# Patient Record
Sex: Female | Born: 1985
Health system: Southern US, Community
[De-identification: ages and names within clinical notes are randomized; demographics above are authoritative.]

## PROBLEM LIST (undated history)

## (undated) DIAGNOSIS — R253 Fasciculation: Secondary | ICD-10-CM

## (undated) DIAGNOSIS — D1803 Hemangioma of intra-abdominal structures: Secondary | ICD-10-CM

## (undated) DIAGNOSIS — E559 Vitamin D deficiency, unspecified: Secondary | ICD-10-CM

## (undated) DIAGNOSIS — F419 Anxiety disorder, unspecified: Secondary | ICD-10-CM

## (undated) DIAGNOSIS — E7800 Pure hypercholesterolemia, unspecified: Secondary | ICD-10-CM

## (undated) DIAGNOSIS — L709 Acne, unspecified: Secondary | ICD-10-CM

## (undated) HISTORY — PX: OTHER SURGICAL HISTORY: SHX169

## (undated) HISTORY — DX: Fasciculation: R25.3

## (undated) HISTORY — DX: Vitamin D deficiency, unspecified: E55.9

## (undated) HISTORY — DX: Acne, unspecified: L70.9

## (undated) HISTORY — DX: Hemangioma of intra-abdominal structures: D18.03

## (undated) HISTORY — DX: Pure hypercholesterolemia, unspecified: E78.00

## (undated) HISTORY — PX: WISDOM TOOTH EXTRACTION: SHX21

---

## 2010-02-07 ENCOUNTER — Encounter: Admission: RE | Admit: 2010-02-07 | Discharge: 2010-02-07 | Payer: Self-pay | Admitting: Obstetrics and Gynecology

## 2010-02-11 ENCOUNTER — Encounter: Admission: RE | Admit: 2010-02-11 | Discharge: 2010-02-11 | Payer: Self-pay | Admitting: Obstetrics and Gynecology

## 2010-11-25 ENCOUNTER — Emergency Department (HOSPITAL_COMMUNITY)
Admission: EM | Admit: 2010-11-25 | Discharge: 2010-11-25 | Payer: Self-pay | Source: Home / Self Care | Admitting: Family Medicine

## 2010-12-10 ENCOUNTER — Encounter
Admission: RE | Admit: 2010-12-10 | Discharge: 2010-12-10 | Payer: Self-pay | Source: Home / Self Care | Attending: Obstetrics and Gynecology | Admitting: Obstetrics and Gynecology

## 2014-12-15 ENCOUNTER — Emergency Department
Admission: EM | Admit: 2014-12-15 | Discharge: 2014-12-15 | Disposition: A | Discharge status: Home / Self Care | Payer: PRIVATE HEALTH INSURANCE | Source: Home / Self Care | Attending: Emergency Medicine | Admitting: Emergency Medicine

## 2014-12-15 ENCOUNTER — Encounter: Payer: Self-pay | Admitting: *Deleted

## 2014-12-15 DIAGNOSIS — J029 Acute pharyngitis, unspecified: Secondary | ICD-10-CM

## 2014-12-15 HISTORY — DX: Anxiety disorder, unspecified: F41.9

## 2014-12-15 LAB — POCT RAPID STREP A (OFFICE): Rapid Strep A Screen: NEGATIVE

## 2014-12-15 MED ORDER — AZITHROMYCIN 250 MG PO TABS: ORAL_TABLET | ORAL | Status: DC

## 2014-12-15 NOTE — ED Notes (Signed)
Pt c/o sore throat x 4 days. Denies fever. No other URI s/s. Reports coworkers with strep.

## 2014-12-15 NOTE — ED Provider Notes (Signed)
CSN: [id number]     Arrival date & time 12/15/14  1347 History   First MD Initiated Contact with Patient 12/15/14 1422     Chief Complaint  Patient presents with  . Sore Throat   (Consider location/radiation/quality/duration/timing/severity/associated sxs/prior Treatment) HPI SORE THROAT Onset: 4 days    Severity: moderate, but progressively worsening Tried OTC meds without significant relief.  Symptoms:  + Fever initially, fever is much less now. 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 Anxiety    History reviewed. No pertinent past surgical history. History reviewed. No pertinent family history. History  Substance Use Topics  . Smoking status: Never Smoker   . Smokeless tobacco: Not on file  . Alcohol Use: Yes   OB History    No data available     Review of Systems  All other systems reviewed and are negative.   Allergies  Penicillins  Home Medications   Prior to Admission medications   Medication Sig Start Date End Date Taking? Authorizing Provider  sertraline (ZOLOFT) 50 MG tablet Take 50 mg by mouth daily.   Yes Historical Provider, MD  azithromycin (ZITHROMAX Z-PAK) 250 MG tablet Take 2 tablets on day one, then 1 tablet daily on days 2 through 5 12/15/14   Jacqulyn Cane, MD   BP 108/65 mmHg  Pulse 77  Temp(Src) 99.1 F (37.3 C) (Oral)  Resp 16  Ht 5\' 6"  (1.676 m)  Wt 124 lb (56.246 kg)  BMI 20.02 kg/m2  SpO2 100%  LMP 11/19/2014 Physical Exam   Constitutional: She is oriented to person, place, and time. She appears well-developed and well-nourished. She is cooperative.  Non-toxic appearance. No distress.  HENT:  Head: Normocephalic and atraumatic.  Right Ear: Tympanic membrane, external ear and ear canal normal.  Left Ear: Tympanic membrane, external ear and ear canal normal.  Nose: Nose normal. Right sinus exhibits no maxillary sinus tenderness and no frontal sinus tenderness. Left sinus exhibits no maxillary sinus tenderness and no frontal sinus tenderness.  Mouth/Throat: Mucous membranes are normal. Posterior oropharyngeal erythema present. No oropharyngeal exudate or posterior oropharyngeal edema.  Eyes: Conjunctivae are normal. No scleral icterus.  Neck: Neck supple.  Cardiovascular: Normal rate, regular rhythm and normal heart sounds.   No murmur heard. Pulmonary/Chest: Effort normal and breath sounds normal. No stridor. No respiratory distress. She has no wheezes. She has no rales.  Musculoskeletal: She exhibits no edema.  Lymphadenopathy:    She has cervical adenopathy.       Right cervical: Superficial cervical adenopathy present. No deep cervical and no posterior cervical adenopathy present.      Left cervical: Superficial cervical adenopathy present. No deep cervical and no posterior cervical adenopathy present.  Neurological: She is alert and oriented to person, place, and time.  Skin: Skin is warm and dry.  Psychiatric: She has a normal mood and affect.  Nursing note and vitals reviewed.   ED Course  Procedures (including critical care time) Labs Review Labs Reviewed  STREP A DNA PROBE  POCT RAPID STREP A (OFFICE)    Imaging Review No results found.   MDM   1. Acute pharyngitis, unspecified pharyngitis type    Rapid strep test negative. Treatment options discussed. We are sending off a strep culture. Reviewed her history of penicillin allergy. Prescribed Zithromax Z-PAK Other symptomatic care  discussed Follow-up with your primary care doctor in 5-7 days if not improving, or sooner if symptoms become worse. Precautions discussed. Red flags discussed. Questions invited and answered. She voiced understanding and agreement.     Jacqulyn Cane, MD 12/15/14 [phone]

## 2014-12-16 LAB — STREP A DNA PROBE: GASP: NEGATIVE

## 2014-12-17 ENCOUNTER — Telehealth: Payer: Self-pay | Admitting: Emergency Medicine

## 2014-12-17 NOTE — ED Notes (Signed)
Inquired about patient's status; encourage them to call with questions/concerns.  

## 2015-05-29 DIAGNOSIS — L700 Acne vulgaris: Secondary | ICD-10-CM | POA: Insufficient documentation

## 2016-12-23 ENCOUNTER — Other Ambulatory Visit: Payer: Self-pay | Admitting: Family Medicine

## 2016-12-23 DIAGNOSIS — R1033 Periumbilical pain: Secondary | ICD-10-CM

## 2016-12-25 ENCOUNTER — Ambulatory Visit
Admission: RE | Admit: 2016-12-25 | Discharge: 2016-12-25 | Disposition: A | Discharge status: Home / Self Care | Payer: PPO | Source: Ambulatory Visit | Attending: Family Medicine | Admitting: Family Medicine

## 2016-12-25 DIAGNOSIS — R1033 Periumbilical pain: Secondary | ICD-10-CM

## 2016-12-25 MED ORDER — IOPAMIDOL (ISOVUE-300) INJECTION 61%
100.0000 mL | Freq: Once | INTRAVENOUS | Status: AC | PRN
  Administered 2016-12-25: 100 mL via INTRAVENOUS

## 2018-12-01 NOTE — L&D Delivery Note (Signed)
Delivery Note Pt pushed great for 20 minutes and at 5:23 PM a healthy female was delivered via Vaginal, Spontaneous (Presentation: Right Occiput Anterior).  APGAR: 7, 9; weight  pending.   Placenta status: Spontaneous, Intact.  Cord: 3 vessels with the following complications: Nuchal x 1 reduced.   There was mild uterine atony, but responded well to pitocin and massage  Anesthesia: Epidural Episiotomy: None Lacerations: 2nd degree, right sulcal Suture Repair: 3.0 vicryl rapide Est. Blood Loss (mL):  455mL  Mom to postpartum.  Baby to Couplet care / Skin to Skin.  Debra Craig 11/08/2019, 6:19 PM

## 2019-04-19 LAB — OB RESULTS CONSOLE GC/CHLAMYDIA
Chlamydia: NEGATIVE
Gonorrhea: NEGATIVE

## 2019-04-21 LAB — OB RESULTS CONSOLE HEPATITIS B SURFACE ANTIGEN: Hepatitis B Surface Ag: NEGATIVE

## 2019-04-21 LAB — OB RESULTS CONSOLE RUBELLA ANTIBODY, IGM: Rubella: IMMUNE

## 2019-08-01 ENCOUNTER — Encounter (HOSPITAL_COMMUNITY): Payer: Self-pay

## 2019-08-01 ENCOUNTER — Other Ambulatory Visit: Payer: Self-pay

## 2019-08-01 ENCOUNTER — Inpatient Hospital Stay (HOSPITAL_COMMUNITY)
Admission: AD | Admit: 2019-08-01 | Discharge: 2019-08-01 | Disposition: A | Discharge status: Home / Self Care | Payer: PPO | Attending: Obstetrics and Gynecology | Admitting: Obstetrics and Gynecology

## 2019-08-01 DIAGNOSIS — O99342 Other mental disorders complicating pregnancy, second trimester: Secondary | ICD-10-CM | POA: Insufficient documentation

## 2019-08-01 DIAGNOSIS — Z880 Allergy status to penicillin: Secondary | ICD-10-CM | POA: Insufficient documentation

## 2019-08-01 DIAGNOSIS — Z79899 Other long term (current) drug therapy: Secondary | ICD-10-CM | POA: Insufficient documentation

## 2019-08-01 DIAGNOSIS — W19XXXA Unspecified fall, initial encounter: Secondary | ICD-10-CM | POA: Insufficient documentation

## 2019-08-01 DIAGNOSIS — F419 Anxiety disorder, unspecified: Secondary | ICD-10-CM | POA: Insufficient documentation

## 2019-08-01 DIAGNOSIS — Z3A24 24 weeks gestation of pregnancy: Secondary | ICD-10-CM | POA: Insufficient documentation

## 2019-08-01 DIAGNOSIS — O9A212 Injury, poisoning and certain other consequences of external causes complicating pregnancy, second trimester: Secondary | ICD-10-CM | POA: Insufficient documentation

## 2019-08-01 DIAGNOSIS — Z3689 Encounter for other specified antenatal screening: Secondary | ICD-10-CM | POA: Insufficient documentation

## 2019-08-01 NOTE — MAU Provider Note (Signed)
History     CSN: [id number]  Arrival date and time: 08/01/19 [id number]   First Provider Initiated Contact with Patient 08/01/19 [phone]      Chief Complaint  Patient presents with  . Fall   33 y.o. G1 @24 .1 wks presenting after a fall. Reports missing one step and falling down scraping her left ribcage and flank on a chair. No head trauma or LOC. Did not land on abdomen. Denies VB, LOF, or ctx. +FM. No abdominal pain but ribs are sore. Rates pain 3/10.   OB History    Gravida  1   Para      Term      Preterm      AB      Living        SAB      TAB      Ectopic      Multiple      Live Births              Past Medical History:  Diagnosis Date  . Anxiety     History reviewed. No pertinent surgical history.  History reviewed. No pertinent family history.  Social History   Tobacco Use  . Smoking status: Never Smoker  . Smokeless tobacco: Never Used  Substance Use Topics  . Alcohol use: Not Currently  . Drug use: No    Allergies:  Allergies  Allergen Reactions  . Penicillins     Medications Prior to Admission  Medication Sig Dispense Refill Last Dose  . azithromycin (ZITHROMAX Z-PAK) 250 MG tablet Take 2 tablets on day one, then 1 tablet daily on days 2 through 5 1 each 0   . sertraline (ZOLOFT) 50 MG tablet Take 50 mg by mouth daily.       Review of Systems  Gastrointestinal: Negative for abdominal pain.  Genitourinary: Negative for vaginal bleeding and vaginal discharge.   Physical Exam   Blood pressure (!) 122/59, pulse 80, temperature 98 F (36.7 C), temperature source Oral.  Physical Exam  Nursing note and vitals reviewed. Constitutional: She is oriented to person, place, and time. She appears well-developed and well-nourished. No distress.  Respiratory:    GI: Soft. She exhibits no distension. There is no abdominal tenderness.  gravid  Musculoskeletal: Normal range of motion.  Neurological: She is alert and oriented to person,  place, and time.  Skin: Skin is warm and dry.  Psychiatric: She has a normal mood and affect.  EFM: 155 bpm, mod variability, + accels, no decels Toco: none  No results found for this or any previous visit (from the past 24 hour(s)).  MAU Course  Procedures Prolonged EFM  MDM NST reactive. Pain is minimal. No evidence abruption or PTL. Stable for discharge home.   Assessment and Plan   1. [redacted] weeks gestation of pregnancy   2. Fall, initial encounter   3. NST (non-stress test) reactive    Discharge home [facility] this [facility] as scheduled Strict return precautions- VB, LOF, ctx, pain, dec FM Tylenol and heat prn  Allergies as of 08/01/2019      Reactions   Penicillins       Medication List    STOP taking these medications   azithromycin 250 MG tablet Commonly known as: Zithromax Z-Pak     TAKE these medications   sertraline 50 MG tablet Commonly known as: ZOLOFT Take 50 mg by mouth daily.      Julianne Handler, CNM 08/01/2019, 10:07 AM

## 2019-08-01 NOTE — Discharge Instructions (Signed)
Preventing Injuries During Pregnancy ° °Injuries can happen during pregnancy. Minor falls and accidents usually do not harm you or your baby. But some injuries can harm you and your baby. Tell your doctor about any injury you suffer. °What can I do to avoid injuries? °Safety °· Remove rugs and loose objects on the floor. °· Wear comfortable shoes that have a good grip. Do not wear shoes that have high heels. °· Always wear your seat belt in the car. The lap belt should be below your belly. Always drive safely. °· Do not ride on a motorcycle. °Activity °· Do not take part in rough and violent activities or sports. °· Avoid: °? Walking on wet or slippery floors. °? Lifting heavy pots of boiling or hot liquids. °? Fixing electrical problems. °? Being near fires. °General instructions °· Take over-the-counter and prescription medicines only as told by your doctor. °· Know your blood type and the blood type of the baby's father. °· If you are a victim of domestic violence: °? Call your local emergency services (911 in the [place] °? Contact the National Domestic Violence Hotline for help and support. °Get help right away if: °· You fall on your belly or receive any serious blow to your belly. °· You have a stiff neck or neck pain after a fall or an injury. °· You get a headache or have problems with vision after an injury. °· You do not feel the baby move or the baby is not moving as much as normal. °· You have been a victim of domestic violence or any other kind of attack. °· You have been in a car accident. °· You have bleeding from your vagina. °· Fluid is leaking from your vagina. °· You start to have cramping or pain in your belly (contractions). °· You have very bad pain in your lower back. °· You feel weak or pass out (faint). °· You start to throw up (vomit) after an injury. °· You have been burned. °Summary °· Some injuries that happen during pregnancy can do harm to the baby. °· Tell your doctor about any  injury. °· Take steps to avoid injury. This includes removing rugs and loose objects on the floor. Always wear your seat belt in the car. °· Do not take part in rough and violent activities or sports. °· Get help right away if you have any serious accident or injury. °This information is not intended to replace advice given to you by your health care provider. Make sure you discuss any questions you have with your health care provider. °Document Released: 12/20/2010 Document Revised: 11/26/2016 Document Reviewed: 11/26/2016 °Elsevier Patient Education © 2020 [employer] ° °

## 2019-08-01 NOTE — MAU Note (Signed)
Pt is a G1P0 at 24.0 weeks that fell down last step at home today at 0600.  Pt states she landed on her side.  No bleeding, contractions or LOF.  Pt has been feeling baby move normally.  No other OB or medical concerns.

## 2019-09-01 DIAGNOSIS — Z3689 Encounter for other specified antenatal screening: Secondary | ICD-10-CM | POA: Diagnosis not present

## 2019-09-02 LAB — OB RESULTS CONSOLE HIV ANTIBODY (ROUTINE TESTING): HIV: NONREACTIVE

## 2019-09-02 LAB — OB RESULTS CONSOLE RPR: RPR: NONREACTIVE

## 2019-09-06 DIAGNOSIS — O99810 Abnormal glucose complicating pregnancy: Secondary | ICD-10-CM | POA: Diagnosis not present

## 2019-09-27 DIAGNOSIS — O99343 Other mental disorders complicating pregnancy, third trimester: Secondary | ICD-10-CM | POA: Diagnosis not present

## 2019-09-27 DIAGNOSIS — Z3A32 32 weeks gestation of pregnancy: Secondary | ICD-10-CM | POA: Diagnosis not present

## 2019-09-27 DIAGNOSIS — Z23 Encounter for immunization: Secondary | ICD-10-CM | POA: Diagnosis not present

## 2019-09-30 DIAGNOSIS — D692 Other nonthrombocytopenic purpura: Secondary | ICD-10-CM | POA: Diagnosis not present

## 2019-10-10 DIAGNOSIS — Z3482 Encounter for supervision of other normal pregnancy, second trimester: Secondary | ICD-10-CM | POA: Diagnosis not present

## 2019-10-10 DIAGNOSIS — Z3483 Encounter for supervision of other normal pregnancy, third trimester: Secondary | ICD-10-CM | POA: Diagnosis not present

## 2019-10-25 DIAGNOSIS — Z3685 Encounter for antenatal screening for Streptococcus B: Secondary | ICD-10-CM | POA: Diagnosis not present

## 2019-10-29 LAB — OB RESULTS CONSOLE GBS: GBS: NEGATIVE

## 2019-11-04 MED FILL — SERTRALINE HCL 50 MG TABS: 50 | 90 days supply | Qty: 90 | Fill #0

## 2019-11-08 ENCOUNTER — Encounter (HOSPITAL_COMMUNITY): Payer: Self-pay

## 2019-11-08 ENCOUNTER — Inpatient Hospital Stay (HOSPITAL_COMMUNITY)
Admission: AD | Admit: 2019-11-08 | Discharge: 2019-11-10 | DRG: 807 | Disposition: A | Discharge status: Home / Self Care | Payer: 59 | Attending: Obstetrics and Gynecology | Admitting: Obstetrics and Gynecology

## 2019-11-08 ENCOUNTER — Inpatient Hospital Stay (HOSPITAL_COMMUNITY): Payer: 59 | Admitting: Anesthesiology

## 2019-11-08 ENCOUNTER — Other Ambulatory Visit: Payer: Self-pay

## 2019-11-08 DIAGNOSIS — Z3A38 38 weeks gestation of pregnancy: Secondary | ICD-10-CM | POA: Diagnosis not present

## 2019-11-08 DIAGNOSIS — Z20828 Contact with and (suspected) exposure to other viral communicable diseases: Secondary | ICD-10-CM | POA: Diagnosis present

## 2019-11-08 DIAGNOSIS — F419 Anxiety disorder, unspecified: Secondary | ICD-10-CM | POA: Diagnosis present

## 2019-11-08 DIAGNOSIS — O99344 Other mental disorders complicating childbirth: Secondary | ICD-10-CM | POA: Diagnosis present

## 2019-11-08 DIAGNOSIS — O26893 Other specified pregnancy related conditions, third trimester: Secondary | ICD-10-CM | POA: Diagnosis present

## 2019-11-08 LAB — COMPREHENSIVE METABOLIC PANEL
ALT: 15 U/L (ref 0–44)
AST: 24 U/L (ref 15–41)
Albumin: 3.2 g/dL — ABNORMAL LOW (ref 3.5–5.0)
Alkaline Phosphatase: 141 U/L — ABNORMAL HIGH (ref 38–126)
Anion gap: 11 (ref 5–15)
BUN: 12 mg/dL (ref 6–20)
CO2: 20 mmol/L — ABNORMAL LOW (ref 22–32)
Calcium: 9.1 mg/dL (ref 8.9–10.3)
Chloride: 104 mmol/L (ref 98–111)
Creatinine, Ser: 0.81 mg/dL (ref 0.44–1.00)
GFR calc Af Amer: >60 mL/min (ref >60)
GFR calc non Af Amer: >60 mL/min (ref >60)
Glucose, Bld: 88 mg/dL (ref 70–99)
Potassium: 3.8 mmol/L (ref 3.5–5.1)
Sodium: 135 mmol/L (ref 135–145)
Total Bilirubin: 0.2 mg/dL — ABNORMAL LOW (ref 0.3–1.2)
Total Protein: 6.5 g/dL (ref 6.5–8.1)

## 2019-11-08 LAB — PROTEIN / CREATININE RATIO, URINE
Creatinine, Urine: 31.03 mg/dL
Total Protein, Urine: <6 mg/dL

## 2019-11-08 LAB — CBC
HCT: 40.3 % (ref 36.0–46.0)
Hemoglobin: 13.9 g/dL (ref 12.0–15.0)
MCH: 32.9 pg (ref 26.0–34.0)
MCHC: 34.5 g/dL (ref 30.0–36.0)
MCV: 95.3 fL (ref 80.0–100.0)
Platelets: 292 10*3/uL (ref 150–400)
RBC: 4.23 MIL/uL (ref 3.87–5.11)
RDW: 12.3 % (ref 11.5–15.5)
WBC: 12.5 10*3/uL — ABNORMAL HIGH (ref 4.0–10.5)
nRBC: 0 % (ref 0.0–0.2)

## 2019-11-08 LAB — SARS CORONAVIRUS 2 (TAT 6-24 HRS): SARS Coronavirus 2: NEGATIVE

## 2019-11-08 LAB — TYPE AND SCREEN
ABO/RH(D): A POS
Antibody Screen: NEGATIVE

## 2019-11-08 LAB — ABO/RH: ABO/RH(D): A POS

## 2019-11-08 MED ORDER — DIBUCAINE (PERIANAL) 1 % EX OINT: 1.0000 "application " | TOPICAL_OINTMENT | CUTANEOUS | Status: DC | PRN

## 2019-11-08 MED ORDER — ONDANSETRON HCL 4 MG/2ML IJ SOLN: 4.0000 mg | INTRAMUSCULAR | Status: DC | PRN

## 2019-11-08 MED ORDER — COCONUT OIL OIL
1.0000 "application " | TOPICAL_OIL | Status: DC | PRN
  Administered 2019-11-10: 1 via TOPICAL

## 2019-11-08 MED ORDER — DIPHENHYDRAMINE HCL 25 MG PO CAPS: 25.0000 mg | ORAL_CAPSULE | Freq: Four times a day (QID) | ORAL | Status: DC | PRN

## 2019-11-08 MED ORDER — OXYCODONE-ACETAMINOPHEN 5-325 MG PO TABS: 1.0000 | ORAL_TABLET | ORAL | Status: DC | PRN

## 2019-11-08 MED ORDER — ACETAMINOPHEN 325 MG PO TABS: 650.0000 mg | ORAL_TABLET | ORAL | Status: DC | PRN

## 2019-11-08 MED ORDER — LACTATED RINGERS IV SOLN
500.0000 mL | Freq: Once | INTRAVENOUS | Status: AC
  Administered 2019-11-08: 500 mL via INTRAVENOUS

## 2019-11-08 MED ORDER — FENTANYL-BUPIVACAINE-NACL 0.5-0.125-0.9 MG/250ML-% EP SOLN
12.0000 mL/h | EPIDURAL | Status: DC | PRN
  Filled 2019-11-08: qty 250

## 2019-11-08 MED ORDER — TETANUS-DIPHTH-ACELL PERTUSSIS 5-2.5-18.5 LF-MCG/0.5 IM SUSP: 0.5000 mL | Freq: Once | INTRAMUSCULAR | Status: DC

## 2019-11-08 MED ORDER — SERTRALINE HCL 100 MG PO TABS
100.0000 mg | ORAL_TABLET | Freq: Every day | ORAL | Status: DC
  Administered 2019-11-08 – 2019-11-09 (×2): 100 mg via ORAL
  Filled 2019-11-08 (×2): qty 1

## 2019-11-08 MED ORDER — ONDANSETRON HCL 4 MG/2ML IJ SOLN: 4.0000 mg | Freq: Four times a day (QID) | INTRAMUSCULAR | Status: DC | PRN

## 2019-11-08 MED ORDER — LACTATED RINGERS IV SOLN
INTRAVENOUS | Status: DC
  Administered 2019-11-08: 13:00:00 via INTRAVENOUS

## 2019-11-08 MED ORDER — DIPHENHYDRAMINE HCL 50 MG/ML IJ SOLN: 12.5000 mg | INTRAMUSCULAR | Status: DC | PRN

## 2019-11-08 MED ORDER — LIDOCAINE HCL (PF) 1 % IJ SOLN
INTRAMUSCULAR | Status: DC | PRN
  Administered 2019-11-08: 6 mL via EPIDURAL

## 2019-11-08 MED ORDER — ONDANSETRON HCL 4 MG PO TABS: 4.0000 mg | ORAL_TABLET | ORAL | Status: DC | PRN

## 2019-11-08 MED ORDER — PHENYLEPHRINE 40 MCG/ML (10ML) SYRINGE FOR IV PUSH (FOR BLOOD PRESSURE SUPPORT): 80.0000 ug | PREFILLED_SYRINGE | INTRAVENOUS | Status: DC | PRN

## 2019-11-08 MED ORDER — LIDOCAINE HCL (PF) 1 % IJ SOLN: 30.0000 mL | INTRAMUSCULAR | Status: DC | PRN

## 2019-11-08 MED ORDER — WITCH HAZEL-GLYCERIN EX PADS: 1.0000 "application " | MEDICATED_PAD | CUTANEOUS | Status: DC | PRN

## 2019-11-08 MED ORDER — ZOLPIDEM TARTRATE 5 MG PO TABS: 5.0000 mg | ORAL_TABLET | Freq: Every evening | ORAL | Status: DC | PRN

## 2019-11-08 MED ORDER — OXYCODONE-ACETAMINOPHEN 5-325 MG PO TABS: 2.0000 | ORAL_TABLET | ORAL | Status: DC | PRN

## 2019-11-08 MED ORDER — SODIUM CHLORIDE (PF) 0.9 % IJ SOLN
INTRAMUSCULAR | Status: DC | PRN
  Administered 2019-11-08: 12 mL/h via EPIDURAL

## 2019-11-08 MED ORDER — BENZOCAINE-MENTHOL 20-0.5 % EX AERO
1.0000 "application " | INHALATION_SPRAY | CUTANEOUS | Status: DC | PRN
  Administered 2019-11-09: 1 via TOPICAL
  Filled 2019-11-08: qty 56

## 2019-11-08 MED ORDER — TERBUTALINE SULFATE 1 MG/ML IJ SOLN: 0.2500 mg | Freq: Once | INTRAMUSCULAR | Status: DC | PRN

## 2019-11-08 MED ORDER — EPHEDRINE 5 MG/ML INJ: 10.0000 mg | INTRAVENOUS | Status: DC | PRN

## 2019-11-08 MED ORDER — OXYTOCIN 40 UNITS IN NORMAL SALINE INFUSION - SIMPLE MED
1.0000 m[IU]/min | INTRAVENOUS | Status: DC
  Administered 2019-11-08: 14:00:00 2 m[IU]/min via INTRAVENOUS
  Filled 2019-11-08: qty 1000

## 2019-11-08 MED ORDER — SENNOSIDES-DOCUSATE SODIUM 8.6-50 MG PO TABS
2.0000 | ORAL_TABLET | ORAL | Status: DC
  Administered 2019-11-09 – 2019-11-10 (×2): 2 via ORAL
  Filled 2019-11-08 (×2): qty 2

## 2019-11-08 MED ORDER — SIMETHICONE 80 MG PO CHEW: 80.0000 mg | CHEWABLE_TABLET | ORAL | Status: DC | PRN

## 2019-11-08 MED ORDER — OXYTOCIN BOLUS FROM INFUSION
500.0000 mL | Freq: Once | INTRAVENOUS | Status: AC
  Administered 2019-11-08: 17:00:00 500 mL via INTRAVENOUS

## 2019-11-08 MED ORDER — PRENATAL MULTIVITAMIN CH
1.0000 | ORAL_TABLET | Freq: Every day | ORAL | Status: DC
  Administered 2019-11-09 – 2019-11-10 (×2): 1 via ORAL
  Filled 2019-11-08 (×2): qty 1

## 2019-11-08 MED ORDER — OXYTOCIN 40 UNITS IN NORMAL SALINE INFUSION - SIMPLE MED: 2.5000 [IU]/h | INTRAVENOUS | Status: DC

## 2019-11-08 MED ORDER — PHENYLEPHRINE 40 MCG/ML (10ML) SYRINGE FOR IV PUSH (FOR BLOOD PRESSURE SUPPORT)
80.0000 ug | PREFILLED_SYRINGE | INTRAVENOUS | Status: DC | PRN
  Filled 2019-11-08: qty 10

## 2019-11-08 MED ORDER — SOD CITRATE-CITRIC ACID 500-334 MG/5ML PO SOLN: 30.0000 mL | ORAL | Status: DC | PRN

## 2019-11-08 MED ORDER — IBUPROFEN 600 MG PO TABS
600.0000 mg | ORAL_TABLET | Freq: Four times a day (QID) | ORAL | Status: DC
  Administered 2019-11-08 – 2019-11-10 (×8): 600 mg via ORAL
  Filled 2019-11-08 (×7): qty 1

## 2019-11-08 MED ORDER — FENTANYL-BUPIVACAINE-NACL 0.5-0.125-0.9 MG/250ML-% EP SOLN: 12.0000 mL/h | EPIDURAL | Status: DC | PRN

## 2019-11-08 MED ORDER — LACTATED RINGERS IV SOLN: 500.0000 mL | INTRAVENOUS | Status: DC | PRN

## 2019-11-08 NOTE — Progress Notes (Signed)
Patient ID: Debra Craig, female   DOB: [birth year], 33 y.o.   MRN: [id number] Pt comfortable with epidural  afeb BP 130-140/80-90  FHR category 1  Cervix c/7-8/0  Making great progress, continue to follow

## 2019-11-08 NOTE — Anesthesia Postprocedure Evaluation (Signed)
Anesthesia Post Note  Patient: Debra Craig CID  Procedure(s) Performed: AN AD [place]     Patient location during evaluation: Mother Baby Anesthesia Type: Epidural Level of consciousness: awake and alert Pain management: pain level controlled Vital Signs Assessment: post-procedure vital signs reviewed and stable Respiratory status: spontaneous breathing, nonlabored ventilation and respiratory function stable Cardiovascular status: stable Postop Assessment: no headache, no backache and epidural receding Anesthetic complications: no    Last Vitals:  Vitals:   11/08/19 1915 11/08/19 1932  BP:  131/73  Pulse:  81  Resp:  18  Temp: 36.8 C 36.8 C  SpO2:  99%    Last Pain:  Vitals:   11/08/19 1932  TempSrc: Oral  PainSc: 0-No pain                 Emillio Ngo

## 2019-11-08 NOTE — H&P (Addendum)
Debra Craig is a 33 y.o. female G1P0 at 12 2/7 weeks (EDD 11/20/19 by known [place] by IUI) presenting for SROM at about 1215pm and mild contractions. Prenatal care significant for pregnancy conceived with IUI and donor sperm given pt desire to conceive and no current relationship.  She has a history of anxiety and has been well-controlled on zoloft. BP in office today slightly elevated to 140's/90 but no proteinuria or PIH symptoms.  OB History    Gravida  1   Para      Term      Preterm      AB      Living        SAB      TAB      Ectopic      Multiple      Live Births             Past Medical History:  Diagnosis Date  . Anxiety    Past Surgical History:  Procedure Laterality Date  . NO PAST SURGERIES     Family History: family history is not on file. Social History:  reports that she has never smoked. She has never used smokeless tobacco. She reports previous alcohol use. She reports that she does not use drugs.     Maternal Diabetes: No Genetic Screening: Normal Maternal Ultrasounds/Referrals: Normal Fetal Ultrasounds or other Referrals:  None Maternal Substance Abuse:  No Significant Maternal Medications:  Meds include: Zoloft Significant Maternal Lab Results:  None Other Comments:  None  Review of Systems  Constitutional: Negative for fever.  Gastrointestinal: Positive for abdominal pain.   Maternal Medical History:  Reason for admission: Rupture of membranes.   Contractions: Onset was 1-2 hours ago.   Frequency: irregular.   Perceived severity is mild.    Fetal activity: Perceived fetal activity is normal.    Prenatal complications: no prenatal complications Prenatal Complications - Diabetes: none.      Height 5' 6.5" (1.689 m), weight 77.1 kg. Maternal Exam:  Uterine Assessment: Contraction strength is mild.  Contraction frequency is irregular.   Abdomen: Patient reports no abdominal tenderness. Fetal presentation: vertex  Introitus:  Normal vulva. Normal vagina.  Amniotic fluid character: clear.  Pelvis: adequate for delivery.      Physical Exam  Constitutional: She appears well-developed and well-nourished.  Cardiovascular: Normal rate and regular rhythm.  Respiratory: Effort normal.  GI: Soft.  Genitourinary:    Vulva and vagina normal.   Musculoskeletal: Normal range of motion.  Neurological: She is alert.  Psychiatric: She has a normal mood and affect.    Prenatal labs: ABO, Rh:  A positive  Antibody:  Negative Rubella: Immune (05/21 0000) RPR: Nonreactive (10/02 0000)  HBsAg: Negative (05/21 0000)  HIV: Non-reactive (10/02 0000)  GBS: Negative/-- (11/28 0000)  Panorama and AFP negative One hour GCT 161 Three hour GTT WNL Sperm donor screened for carrier status and negative  Assessment/Plan: Pt admitted with SROM for augmentation.   BP slightly elevated so will monitor and check PIH labs, but no sx.  Epidural prn  Logan Bores 11/08/2019, 1:30 PM

## 2019-11-08 NOTE — Lactation Note (Signed)
This note was copied from a baby's chart. Lactation Consultation Note  Patient Name: Debra Craig [id number] Date: 11/08/2019 Reason for consult: Initial assessment;Early term 37-38.6wks;Primapara;1st time breastfeeding  P1 mother whose infant is now 57 hours old.  This is an ETI at 38+2 weeks.  Baby was asleep in the bassinet when I arrived.  Mother has attempted to breast feed a few times since delivery but baby has been sleepy.  Reassured mother that this is typical behavior at this age.  Also discussed the ETI with mother.    Encouraged to feed 8-12 times/24 hours or sooner if baby shows feeding cues.  Reviewed cues.  Mother was familiar with hand expression stating a nurse had shown her how.  She did not wish for any review at this time.  Colostrum container provided and milk storage times reviewed.  Finger feeding demonstrated.   Mom made aware of O/P services, breastfeeding support groups, community resources, and our phone # for post-discharge questions. Mother has a DEBP for home use.  Encouraged her to call her RN/LC for latch assistance as needed.  Family support person present.     Maternal Data Formula Feeding for Exclusion: No Has patient been taught Hand Expression?: Yes Does the patient have breastfeeding experience prior to this delivery?: No  Feeding Feeding Type: Breast Fed  LATCH Score Latch: Repeated attempts needed to sustain latch, nipple held in mouth throughout feeding, stimulation needed to elicit sucking reflex.  Audible Swallowing: A few with stimulation  Type of Nipple: Everted at rest and after stimulation  Comfort (Breast/Nipple): Soft / non-tender  Hold (Positioning): Assistance needed to correctly position infant at breast and maintain latch.  LATCH Score: 7  Interventions Interventions: Breast feeding basics reviewed;Assisted with latch;Skin to skin;Breast massage;Hand express;Support pillows;Adjust position  Lactation Tools Discussed/Used      Consult Status Consult Status: Follow-up Date: 11/09/19 Follow-up type: In-patient    Little Ishikawa 11/08/2019, 9:50 PM

## 2019-11-08 NOTE — Anesthesia Procedure Notes (Signed)
Epidural Patient location during procedure: OB Start time: 11/08/2019 2:02 PM End time: 11/08/2019 2:06 PM  Staffing Anesthesiologist: Janeece Riggers, MD  Preanesthetic Checklist Completed: patient identified, site marked, surgical consent, pre-op evaluation, timeout performed, IV checked, risks and benefits discussed and monitors and equipment checked  Epidural Patient position: sitting Prep: site prepped and draped and DuraPrep Patient monitoring: continuous pulse ox and blood pressure Approach: midline Location: L3-L4 Injection technique: LOR air  Needle:  Needle type: Tuohy  Needle gauge: 17 G Needle length: 9 cm and 9 Needle insertion depth: 5 cm cm Catheter type: closed end flexible Catheter size: 19 Gauge Catheter at skin depth: 10 cm Test dose: negative  Assessment Events: blood not aspirated, injection not painful, no injection resistance, negative IV test and no paresthesia

## 2019-11-08 NOTE — Anesthesia Preprocedure Evaluation (Addendum)
Anesthesia Evaluation  Patient identified by MRN, date of birth, ID band Patient awake    Reviewed: Allergy & Precautions, H&P , NPO status , Patient's Chart, lab work & pertinent test results, reviewed documented beta blocker date and time   Airway Mallampati: I  TM Distance: >3 FB Neck ROM: full    Dental no notable dental hx. (+) Teeth Intact, Dental Advisory Given   Pulmonary neg pulmonary ROS,    Pulmonary exam normal breath sounds clear to auscultation       Cardiovascular negative cardio ROS Normal cardiovascular exam Rhythm:regular Rate:Normal     Neuro/Psych Anxiety negative neurological ROS     GI/Hepatic negative GI ROS, Neg liver ROS,   Endo/Other  negative endocrine ROS  Renal/GU negative Renal ROS  negative genitourinary   Musculoskeletal   Abdominal   Peds  Hematology negative hematology ROS (+)   Anesthesia Other Findings   Reproductive/Obstetrics (+) Pregnancy                            Anesthesia Physical Anesthesia Plan  ASA: II  Anesthesia Plan: Epidural   Post-op Pain Management:    Induction:   PONV Risk Score and Plan:   Airway Management Planned:   Additional Equipment:   Intra-op Plan:   Post-operative Plan:   Informed Consent: I have reviewed the patients History and Physical, chart, labs and discussed the procedure including the risks, benefits and alternatives for the proposed anesthesia with the patient or authorized representative who has indicated his/her understanding and acceptance.       Plan Discussed with: Anesthesiologist  Anesthesia Plan Comments:         Anesthesia Quick Evaluation

## 2019-11-09 ENCOUNTER — Encounter (HOSPITAL_COMMUNITY): Payer: Self-pay

## 2019-11-09 LAB — CBC
HCT: 30.6 % — ABNORMAL LOW (ref 36.0–46.0)
Hemoglobin: 10.6 g/dL — ABNORMAL LOW (ref 12.0–15.0)
MCH: 33.2 pg (ref 26.0–34.0)
MCHC: 34.6 g/dL (ref 30.0–36.0)
MCV: 95.9 fL (ref 80.0–100.0)
Platelets: 233 10*3/uL (ref 150–400)
RBC: 3.19 MIL/uL — ABNORMAL LOW (ref 3.87–5.11)
RDW: 12.1 % (ref 11.5–15.5)
WBC: 15.6 10*3/uL — ABNORMAL HIGH (ref 4.0–10.5)
nRBC: 0 % (ref 0.0–0.2)

## 2019-11-09 LAB — RPR: RPR Ser Ql: NONREACTIVE

## 2019-11-09 NOTE — Progress Notes (Signed)
MOB was referred for history of depression/anxiety. * Referral screened out by Clinical Social Worker because none of the following criteria appear to apply: ~ History of anxiety/depression during this pregnancy, or of post-partum depression following prior delivery. ~ Diagnosis of anxiety and/or depression within last 3 years OR * MOB's symptoms currently being treated with medication and/or therapy.Per further chart review, MOB on Zoloft for depression/anxiety.     Please contact the Clinical Social Worker if needs arise, by [facility] request, or if MOB scores greater than 9/yes to question 10 on [place] Postpartum Depression Screen.   Virgie Dad Taneya Conkel, MSW, LCSW [facility] [place] [facility] [facility] [place] [phone]

## 2019-11-09 NOTE — Lactation Note (Signed)
This note was copied from a baby's chart. Lactation Consultation Note  Patient Name: Debra Craig [id number] Date: 11/09/2019 Reason for consult: Follow-up assessment;Mother's request;Early term 37-38.6wks;Primapara;1st time breastfeeding  [id number] LC called to mom's room by RN, mom requesting LC assessment  F/U visit with P1 mom who delivered @ 38.2wks, baby is now 45 hours old with 4% wt loss  LC entered room to find mom in bed with baby asleep STS on her chest. Mom states she is having trouble getting baby latched; was given a NS during the night and states breastfeeding has improved some. Mom states baby will latch but will not stay latched. Mom offered to wake baby for feeding while LC present.  Baby easily awoken by removing from mom's chest and repositioning. Reviewed hand expression with mom prior to latching and mom able to express drops easily. Mom noted to have small areola and small erect nipples with mild abrasions on right nipple. Reviewed basic latch techniques re: having bottom lip touch first and "rock" the baby onto the breast tissue. Baby with tendency to have shallow latch and short angle at the corner of the mouth. Chin tug and latch release techniques demonstrated. Mom somewhat awkward with positioning baby in cross-cradle hold. Encouraged mom to switch her hands and use right hand to support baby's head and use left hand to "sandwich" left breast for baby to latch. Mom with tendency to have infant resting on pillow and using opposite hand to support baby's head. Reviewed body alignment with having ears/shoulders/hips in straight line during feeding. Reviewed deep latch re: nose/chin touching breast during feeding, wide angle at the corner of the mouth and both lips flanged. Unable to visualize mom's areola when infant had deep latch; brought this to mom's attention as a sign of deep latch. Infant stooled during feeding and seemed agitated. Diaper changed by mom and then mom  latched baby back on to breast independently.  Praised mom for efforts and discouraged use of nipple shield if possible; baby is able to latch without shield.  Reviewed IP/OP lactation services; encouraged to call out with any further needs. Baby still feeding at the breast when [facility] left room.  Maternal Data Has patient been taught Hand Expression?: Yes Does the patient have breastfeeding experience prior to this delivery?: No  Feeding Feeding Type: Breast Fed  LATCH Score Latch: Grasps breast easily, tongue down, lips flanged, rhythmical sucking.  Audible Swallowing: Spontaneous and intermittent  Type of Nipple: Everted at rest and after stimulation  Comfort (Breast/Nipple): Filling, red/small blisters or bruises, mild/mod discomfort  Hold (Positioning): Assistance needed to correctly position infant at breast and maintain latch.  LATCH Score: 8  Interventions Interventions: Breast feeding basics reviewed;Assisted with latch;Skin to skin;Breast massage;Hand express;Position options;Support pillows;Adjust position  Lactation Tools Discussed/Used WIC Program: No   Consult Status Consult Status: Follow-up Date: 11/10/19 Follow-up type: In-patient    Cranston Neighbor 11/09/2019, 1:41 PM

## 2019-11-09 NOTE — Anesthesia Postprocedure Evaluation (Signed)
Anesthesia Post Note  Patient: Debra Craig  Procedure(s) Performed: AN AD [place]     Patient location during evaluation: Mother Baby Anesthesia Type: Epidural Level of consciousness: awake and alert Pain management: pain level controlled Vital Signs Assessment: post-procedure vital signs reviewed and stable Respiratory status: spontaneous breathing, nonlabored ventilation and respiratory function stable Cardiovascular status: stable Postop Assessment: no headache, no backache and epidural receding Anesthetic complications: no    Last Vitals:  Vitals:   11/09/19 0100 11/09/19 0600  BP: 119/67 116/72  Pulse: 76 75  Resp: 18 18  Temp: 36.9 C 36.8 C  SpO2: 100% 100%    Last Pain:  Vitals:   11/09/19 0600  TempSrc: Oral  PainSc: 2    Pain Goal:                   [name]

## 2019-11-09 NOTE — Progress Notes (Signed)
Post Partum Day 1 Subjective: no complaints, up ad lib, voiding, tolerating PO and nl lochia, pain controlled  Objective: Blood pressure 131/66, pulse 72, temperature 98.6 F (37 C), temperature source Oral, resp. rate 18, height 5' 6.5" (1.689 m), weight 77.1 kg, SpO2 100 %, unknown if currently breastfeeding.  Physical Exam:  General: alert and no distress Lochia: appropriate Uterine Fundus: firm  Recent Labs    11/08/19 1243 11/09/19 0452  HGB 13.9 10.6*  HCT 40.3 30.6*    Assessment/Plan: Plan for discharge tomorrow, Breastfeeding and Lactation consult.  Routine care   LOS: 1 day   Debra Craig 11/09/2019, 9:48 AM

## 2019-11-09 NOTE — Progress Notes (Signed)
Patient stated to nurse that she has been feeling her heart skip beats throughout the day. Nurse notified Dr. Melba Coon. Stat EKG was ordered. EKG showed normal sinus rhythm. No further orders at this time.

## 2019-11-09 NOTE — Progress Notes (Signed)
Patient ID: Debra Craig, female   DOB: 1986-10-21, 33 y.o.   MRN: [id number]  Called secondary to pt feeling like she was skipping heart beats.  Happening more frequently.  Normal Sinus Rhythm EKG.  Workup can be as outpatient Pt has family members w same symptoms  Hgb 13.9 -> 10.6 this am

## 2019-11-10 MED ORDER — IBUPROFEN 600 MG PO TABS: 600.0000 mg | ORAL_TABLET | Freq: Four times a day (QID) | ORAL | 1 refills | Status: DC | PRN

## 2019-11-10 NOTE — Discharge Summary (Signed)
OB Discharge Summary     Patient Name: Debra Craig DOB: [birth year] MRN: [id number]  Date of admission: 11/08/2019 Delivering MD: Paula Compton   Date of discharge: 11/10/2019  Admitting diagnosis: 38wks water broke Intrauterine pregnancy: [redacted]w[redacted]d     Secondary diagnosis:  Active Problems:   Indication for care in labor or delivery   NSVD (normal spontaneous vaginal delivery)  Additional problems: none     Discharge diagnosis: Term Pregnancy Delivered                                                                                                Post partum procedures:none  Augmentation: Pitocin  Complications: None  Hospital course:  Onset of Labor With Vaginal Delivery     33 y.o. yo G1P1001 at [redacted]w[redacted]d was admitted in Active Labor on 11/08/2019. Patient had an uncomplicated labor course as follows:  Membrane Rupture Time/Date: 12:00 PM ,11/08/2019   Intrapartum Procedures: Episiotomy: None [1]                                         Lacerations:  2nd degree [3]  Patient had a delivery of a Viable infant. 11/08/2019  Information for the patient's newborn:  Anitza, Amante [id number]  Delivery Method: Vag-Spont     Pateint had an uncomplicated postpartum course.  She is ambulating, tolerating a regular diet, passing flatus, and urinating well. Patient is discharged home in stable condition on 11/10/19.   Physical exam  Vitals:   11/09/19 0736 11/09/19 1418 11/09/19 2021 11/10/19 0540  BP: 131/66 121/82 124/71 118/73  Pulse: 72 68 77 63  Resp: 18 18 20 20   Temp: 98.6 F (37 C) 98.1 F (36.7 C) 98.4 F (36.9 C) 97.7 F (36.5 C)  TempSrc: Oral Oral Oral Oral  SpO2:  100% 100% 100%  Weight:      Height:       General: alert, cooperative and no distress Lochia: appropriate Uterine Fundus: firm Incision: N/A DVT Evaluation: No evidence of DVT seen on physical exam. Labs: Lab Results  Component Value Date   WBC 15.6 (H) 11/09/2019   HGB 10.6 (L) 11/09/2019    HCT 30.6 (L) 11/09/2019   MCV 95.9 11/09/2019   PLT 233 11/09/2019   CMP Latest Ref Rng & Units 11/08/2019  Glucose 70 - 99 mg/dL 88  BUN 6 - 20 mg/dL 12  Creatinine 0.44 - 1.00 mg/dL 0.81  Sodium 135 - 145 mmol/L 135  Potassium 3.5 - 5.1 mmol/L 3.8  Chloride 98 - 111 mmol/L 104  CO2 22 - 32 mmol/L 20(L)  Calcium 8.9 - 10.3 mg/dL 9.1  Total Protein 6.5 - 8.1 g/dL 6.5  Total Bilirubin 0.3 - 1.2 mg/dL 0.2(L)  Alkaline Phos 38 - 126 U/L 141(H)  AST 15 - 41 U/L 24  ALT 0 - 44 U/L 15    Discharge instruction: per After Visit Summary and "Baby and Me Booklet".  After visit meds:  Allergies as of 11/10/2019  Reactions   Fish Allergy Nausea And Vomiting   Penicillins    Did it involve swelling of the face/tongue/throat, SOB, or low BP? No Did it involve sudden or severe rash/hives, skin peeling, or any reaction on the inside of your mouth or nose? no Did you need to seek medical attention at a hospital or doctor's office? No  When did it last happen? If all above answers are "NO", may proceed with cephalosporin use.      Medication List    TAKE these medications   ibuprofen 600 MG tablet Commonly known as: ADVIL Take 1 tablet (600 mg total) by mouth every 6 (six) hours as needed for moderate pain or cramping.   prenatal multivitamin Tabs tablet Take 1 tablet by mouth daily at 12 noon.   sertraline 100 MG tablet Commonly known as: ZOLOFT Take 100 mg by mouth daily.       Diet: routine diet  Activity: Advance as tolerated. Pelvic rest for 6 weeks.   Outpatient follow up:6 weeks Follow up Appt:No future appointments. Follow up Visit:No follow-ups on file.  Postpartum contraception: Not Discussed  Newborn Data: Live born female  Birth Weight: 6 lb 10.9 oz (3030 g) APGAR: 7, 9  Newborn Delivery   Birth date/time: 11/08/2019 17:23:00 Delivery type: Vaginal, Spontaneous      Baby Feeding: Breast Disposition:home with mother   11/10/2019 Isaiah Serge, DO

## 2019-11-10 NOTE — Discharge Instructions (Signed)
Call office with any concerns [phone] 

## 2019-11-10 NOTE — Progress Notes (Signed)
Post Partum Day 2 Subjective: up ad lib, voiding, tolerating PO and pain well controlled. Pt reported had skipped heart beats last night - EKG was NSR. She is bonding well with baby - breastfeeding. Lochia mild. Ready for discharge to home today.   Objective: Blood pressure 118/73, pulse 63, temperature 97.7 F (36.5 C), temperature source Oral, resp. rate 20, height 5' 6.5" (1.689 m), weight 77.1 kg, SpO2 100 %, unknown if currently breastfeeding.  Physical Exam:  General: alert, cooperative and no distress Lochia: appropriate Uterine Fundus: firm Incision: n/a DVT Evaluation: No evidence of DVT seen on physical exam.  Recent Labs    11/08/19 1243 11/09/19 0452  HGB 13.9 10.6*  HCT 40.3 30.6*    Assessment/Plan: Discharge home and Breastfeeding   LOS: 2 days   Cecilia W Banga 11/10/2019, 9:29 AM

## 2019-11-10 NOTE — Lactation Note (Signed)
This note was copied from a baby's chart. Lactation Consultation Note  Patient Name: Girl Devena Arceneaux [id number] Date: 11/10/2019 Reason for consult: Follow-up assessment;Difficult latch;Primapara;1st time breastfeeding;Infant weight loss;Early term 37-38.6wks;Nipple pain/trauma  LC in to visit with P1 Mom of ET infant at 62 hrs.  Baby at 8.4% weight loss with adequate output.    Mom has been using a nipple shield (20 mm) to assist with latching baby.  Mom's nipples are tender, using coconut oil.  DEBP set up at bedside, but Mom has not pumped and wasn't sure when she should.   Baby sleepy at first try, and assisted Mom to double pump on initiation setting.  Mom's breasts are filling and transitional milk easily expressed.    Baby started cueing and fussing after 5 mins of pumping.  Mom stopped and reviewed how to apply nipple shield to pull nipple well into shield.  3 ml of colostrum instilled into nipple shield.  Reviewed cross cradle hold and baby able to attain a deep latch to breast.  Baby noted to be sleepy, but swallows identified after initial colostrum ingested.  Mom taught to use alternate breast compression to increase milk transfer.  Baby came off at 15 mins and Mom burping.  Mom will put baby back on breast, or double pump a full 15 mins to support her milk supply.  Plan- 1- Keep baby STS as much as possible 2- Offer breast with cues (goal of 8-12 feedings per 24 hrs), use nipple shield and EBM to front load the shield 3- Compress breast during feeding to increase milk transfer 4-Pump both breasts 15-20 mins after every other feeding to support a full milk supply.  Talked about lactation follow-up.  Mom will consult with LC at Pediatrician Office.  Mom aware of OP lactation appointment available and has phone number.     Feeding Feeding Type: Breast Fed  LATCH Score Latch: Grasps breast easily, tongue down, lips flanged, rhythmical sucking.  Audible Swallowing: A few with  stimulation  Type of Nipple: Everted at rest and after stimulation  Comfort (Breast/Nipple): Soft / non-tender  Hold (Positioning): Assistance needed to correctly position infant at breast and maintain latch.  LATCH Score: 8  Interventions Interventions: Breast feeding basics reviewed;Assisted with latch;Skin to skin;Breast massage;Hand express;Pre-pump if needed;Breast compression;Adjust position;Support pillows;Position options;Expressed milk;Coconut oil;Hand pump;DEBP  Lactation Tools Discussed/Used Tools: Pump;Flanges;Nipple Shields;Coconut oil Nipple shield size: 20 Flange Size: 21 Breast pump type: Double-Electric Breast Pump;Manual   Consult Status Consult Status: Complete Date: 11/10/19 Follow-up type: Call as needed    Broadus John 11/10/2019, 10:58 AM

## 2019-11-11 MED FILL — IBUPROFEN 600 MG TABLET: 600 | 10 days supply | Qty: 40 | Fill #0

## 2019-12-19 DIAGNOSIS — F419 Anxiety disorder, unspecified: Secondary | ICD-10-CM | POA: Diagnosis not present

## 2019-12-21 DIAGNOSIS — Z3483 Encounter for supervision of other normal pregnancy, third trimester: Secondary | ICD-10-CM | POA: Diagnosis not present

## 2019-12-21 DIAGNOSIS — F419 Anxiety disorder, unspecified: Secondary | ICD-10-CM | POA: Diagnosis not present

## 2019-12-21 DIAGNOSIS — R253 Fasciculation: Secondary | ICD-10-CM | POA: Diagnosis not present

## 2019-12-21 DIAGNOSIS — Z3482 Encounter for supervision of other normal pregnancy, second trimester: Secondary | ICD-10-CM | POA: Diagnosis not present

## 2019-12-22 DIAGNOSIS — Z1389 Encounter for screening for other disorder: Secondary | ICD-10-CM | POA: Diagnosis not present

## 2019-12-22 DIAGNOSIS — O99343 Other mental disorders complicating pregnancy, third trimester: Secondary | ICD-10-CM | POA: Diagnosis not present

## 2019-12-22 DIAGNOSIS — Z3009 Encounter for other general counseling and advice on contraception: Secondary | ICD-10-CM | POA: Diagnosis not present

## 2019-12-22 MED FILL — SERTRALINE HCL 50 MG TABLET: 50 | 60 days supply | Qty: 180 | Fill #0

## 2019-12-26 ENCOUNTER — Encounter: Payer: Self-pay | Admitting: Neurology

## 2019-12-28 DIAGNOSIS — F53 Postpartum depression: Secondary | ICD-10-CM | POA: Diagnosis not present

## 2020-01-03 MED FILL — FLUCONAZOLE 150 MG TABS: 150 | 14 days supply | Qty: 14 | Fill #0

## 2020-01-12 DIAGNOSIS — F53 Postpartum depression: Secondary | ICD-10-CM | POA: Diagnosis not present

## 2020-01-13 DIAGNOSIS — F419 Anxiety disorder, unspecified: Secondary | ICD-10-CM | POA: Insufficient documentation

## 2020-01-20 ENCOUNTER — Ambulatory Visit: Payer: 59 | Admitting: Neurology

## 2020-01-23 ENCOUNTER — Encounter: Payer: Self-pay | Admitting: Neurology

## 2020-01-26 ENCOUNTER — Telehealth (INDEPENDENT_AMBULATORY_CARE_PROVIDER_SITE_OTHER): Payer: 59 | Admitting: Neurology

## 2020-01-26 ENCOUNTER — Encounter: Payer: Self-pay | Admitting: Neurology

## 2020-01-26 ENCOUNTER — Other Ambulatory Visit: Payer: Self-pay

## 2020-01-26 DIAGNOSIS — R253 Fasciculation: Secondary | ICD-10-CM | POA: Diagnosis not present

## 2020-01-26 NOTE — Progress Notes (Signed)
New Patient Virtual Visit via Video Note The purpose of this virtual visit is to provide medical care while limiting exposure to the novel coronavirus.    Consent was obtained for video visit:  Yes.   Answered questions that patient had about telehealth interaction:  Yes.   I discussed the limitations, risks, security and privacy concerns of performing an evaluation and management service by telemedicine. I also discussed with the patient that there may be a patient responsible charge related to this service. The patient expressed understanding and agreed to proceed.  Pt location: Home Physician Location: office Name of referring provider:  Harlan Stains, MD I connected with Debra Craig at patients initiation/request on 01/26/2020 at 10:50 AM EST by video enabled telemedicine application and verified that I am speaking with the correct person using two identifiers. Pt MRN:  [id number] Pt DOB:  1986-03-09 Video Participants:  Debra Craig    History of Present Illness: Debra Craig is a 34 y.o. female with anxiety presenting for evaluation of muscle twitches.  Over the past 2 months, patient reports having episodic muscle twitch of the right thigh as well as intermittent spells in the toes.  She denies any weakness, difficulty swallowing, shortness of breath, or difficulty talking.  She endorses high anxiety and looked up muscle twitching online and became even more anxious about possibility of conditions like ALS.  She is 2 months post-partum and endorses poor sleep and inadequate rest.  Past Medical History:  Diagnosis Date  . Acne   . Anxiety   . Elevated LDL cholesterol level   . Liver hemangioma   . Muscle twitching   . Vitamin D deficiency     Past Surgical History:  Procedure Laterality Date  . IUI    . vaginal delievery       Medications:  Outpatient Encounter Medications as of 01/26/2020  Medication Sig  . Prenatal Vit-Fe Fumarate-FA (PRENATAL MULTIVITAMIN) TABS  tablet Take 1 tablet by mouth daily at 12 noon.  . sertraline (ZOLOFT) 50 MG tablet    No facility-administered encounter medications on file as of 01/26/2020.    Allergies:  Allergies  Allergen Reactions  . Fish Allergy Nausea And Vomiting  . Penicillins     Did it involve swelling of the face/tongue/throat, SOB, or low BP? No Did it involve sudden or severe rash/hives, skin peeling, or any reaction on the inside of your mouth or nose? no Did you need to seek medical attention at a hospital or doctor's office? No  When did it last happen? If all above answers are "NO", may proceed with cephalosporin use.    Family History: No family history on file.  Social History: Social History   Tobacco Use  . Smoking status: Never Smoker  . Smokeless tobacco: Never Used  Substance Use Topics  . Alcohol use: Not Currently  . Drug use: No   Social History   Social History Narrative   Right Handed   2 story home   Vital Signs:  LMP  (Exact Date)    General Medical Exam:  Well appearing, comfortable.  Nonlabored breathing.  No deformity or edema.  No rash.  Neurological Exam: MENTAL STATUS including orientation to time, place, person, recent and remote memory, attention span and concentration, language, and fund of knowledge is normal.  Speech is not dysarthric.  CRANIAL NERVES:  Normal conjugate, extra-ocular eye movements in all directions of gaze.  No ptosis.  Normal facial symmetry and movements.  Normal shoulder shrug and head rotation.  Tongue is midline.  MOTOR:  Antigravity in all extremities.  No abnormal movements.  No pronator drift.  COORDINATION/GAIT:  Intact rapid alternating movements bilaterally.  .  Gait narrow based and stable. Stressed gait intact    IMPRESSION: Muscle twitching due fatigue.  Patient reassured she does not have any worrisome neurodegenerative condition and symptoms are due to inadequate sleep and exacerbated by anxiety.  No testing is  indicated.  Hopefully, once she is able to get better rest, symptoms will improve.    Follow Up Instructions:  I discussed the assessment and treatment plan with the patient. The patient was provided an opportunity to ask questions and all were answered. The patient agreed with the plan and demonstrated an understanding of the instructions.   The patient was advised to call back or seek an in-person evaluation if the symptoms worsen or if the condition fails to improve as anticipated.   Alda Berthold, DO

## 2020-02-08 DIAGNOSIS — H5213 Myopia, bilateral: Secondary | ICD-10-CM | POA: Diagnosis not present

## 2020-02-09 MED FILL — METOCLOPRAMIDE 10 MG TABLET: 10 | 30 days supply | Qty: 90 | Fill #0

## 2020-02-23 MED FILL — SERTRALINE HCL 50 MG TABLET: 50 | 60 days supply | Qty: 180 | Fill #1

## 2020-03-28 DIAGNOSIS — Z3483 Encounter for supervision of other normal pregnancy, third trimester: Secondary | ICD-10-CM | POA: Diagnosis not present

## 2020-03-28 DIAGNOSIS — Z3482 Encounter for supervision of other normal pregnancy, second trimester: Secondary | ICD-10-CM | POA: Diagnosis not present

## 2020-05-21 DIAGNOSIS — H01113 Allergic dermatitis of right eye, unspecified eyelid: Secondary | ICD-10-CM | POA: Diagnosis not present

## 2020-05-21 DIAGNOSIS — L259 Unspecified contact dermatitis, unspecified cause: Secondary | ICD-10-CM | POA: Diagnosis not present

## 2020-05-21 DIAGNOSIS — H01116 Allergic dermatitis of left eye, unspecified eyelid: Secondary | ICD-10-CM | POA: Diagnosis not present

## 2020-05-23 MED FILL — hydrOXYzine HCL 25 MG TABS: 25 | 15 days supply | Qty: 30 | Fill #0

## 2020-05-23 MED FILL — HYDROCORTISONE 2.5% OINT: 2.5 | 20 days supply | Qty: 28 | Fill #0

## 2020-06-06 MED FILL — SERTRALINE HCL 50 MG TABLET: 50 | 60 days supply | Qty: 180 | Fill #2

## 2020-06-26 DIAGNOSIS — J301 Allergic rhinitis due to pollen: Secondary | ICD-10-CM | POA: Diagnosis not present

## 2020-06-26 DIAGNOSIS — J3081 Allergic rhinitis due to animal (cat) (dog) hair and dander: Secondary | ICD-10-CM | POA: Diagnosis not present

## 2020-06-26 DIAGNOSIS — L309 Dermatitis, unspecified: Secondary | ICD-10-CM | POA: Diagnosis not present

## 2020-06-26 DIAGNOSIS — J3089 Other allergic rhinitis: Secondary | ICD-10-CM | POA: Diagnosis not present

## 2020-06-26 MED FILL — LEVOCETIRIZINE 5 MG TABLET: 5 | 30 days supply | Qty: 30 | Fill #0

## 2020-06-26 MED FILL — PIMECROLIMUS 1 % CREA: 1 | 30 days supply | Qty: 60 | Fill #0

## 2020-08-09 ENCOUNTER — Other Ambulatory Visit (HOSPITAL_COMMUNITY): Payer: Self-pay | Admitting: Obstetrics and Gynecology

## 2020-08-09 MED FILL — SERTRALINE HCL 50 MG TABLET: 50 | 60 days supply | Qty: 180 | Fill #0

## 2020-08-20 DIAGNOSIS — N6489 Other specified disorders of breast: Secondary | ICD-10-CM | POA: Diagnosis not present

## 2020-08-21 MED FILL — CLINDAMYCIN HCL 300 MG CAPS: 300 | 7 days supply | Qty: 14 | Fill #0

## 2020-09-17 DIAGNOSIS — Z20822 Contact with and (suspected) exposure to covid-19: Secondary | ICD-10-CM | POA: Diagnosis not present

## 2020-09-25 ENCOUNTER — Ambulatory Visit: Payer: 59 | Admitting: Family Medicine

## 2020-09-29 DIAGNOSIS — X58XXXA Exposure to other specified factors, initial encounter: Secondary | ICD-10-CM | POA: Diagnosis not present

## 2020-09-29 DIAGNOSIS — T7840XA Allergy, unspecified, initial encounter: Secondary | ICD-10-CM | POA: Diagnosis not present

## 2020-10-22 DIAGNOSIS — N926 Irregular menstruation, unspecified: Secondary | ICD-10-CM | POA: Diagnosis not present

## 2020-10-22 DIAGNOSIS — Z319 Encounter for procreative management, unspecified: Secondary | ICD-10-CM | POA: Diagnosis not present

## 2020-10-22 DIAGNOSIS — E2839 Other primary ovarian failure: Secondary | ICD-10-CM | POA: Diagnosis not present

## 2020-10-22 DIAGNOSIS — Z3200 Encounter for pregnancy test, result unknown: Secondary | ICD-10-CM | POA: Diagnosis not present

## 2020-11-07 ENCOUNTER — Ambulatory Visit: Payer: 59 | Admitting: Family Medicine

## 2020-11-12 ENCOUNTER — Other Ambulatory Visit (HOSPITAL_COMMUNITY): Payer: Self-pay | Admitting: Obstetrics and Gynecology

## 2020-11-12 DIAGNOSIS — E2839 Other primary ovarian failure: Secondary | ICD-10-CM | POA: Diagnosis not present

## 2020-11-12 DIAGNOSIS — N926 Irregular menstruation, unspecified: Secondary | ICD-10-CM | POA: Diagnosis not present

## 2020-11-12 DIAGNOSIS — Z3189 Encounter for other procreative management: Secondary | ICD-10-CM | POA: Diagnosis not present

## 2020-11-13 MED FILL — ESTRADIOL 0.1 MG PATCH: 0.1 | 28 days supply | Qty: 8 | Fill #0

## 2020-11-26 DIAGNOSIS — N926 Irregular menstruation, unspecified: Secondary | ICD-10-CM | POA: Diagnosis not present

## 2020-11-26 DIAGNOSIS — E2839 Other primary ovarian failure: Secondary | ICD-10-CM | POA: Diagnosis not present

## 2020-11-26 DIAGNOSIS — Z3189 Encounter for other procreative management: Secondary | ICD-10-CM | POA: Diagnosis not present

## 2020-11-27 ENCOUNTER — Other Ambulatory Visit (HOSPITAL_COMMUNITY): Payer: Self-pay | Admitting: Obstetrics and Gynecology

## 2020-11-27 MED FILL — TAMOXIFEN 20 MG TABLET: 20 | 30 days supply | Qty: 60 | Fill #0

## 2020-12-03 ENCOUNTER — Other Ambulatory Visit (HOSPITAL_COMMUNITY): Payer: Self-pay | Admitting: Obstetrics and Gynecology

## 2020-12-03 DIAGNOSIS — E2839 Other primary ovarian failure: Secondary | ICD-10-CM | POA: Diagnosis not present

## 2020-12-03 DIAGNOSIS — Z3189 Encounter for other procreative management: Secondary | ICD-10-CM | POA: Diagnosis not present

## 2020-12-03 DIAGNOSIS — N926 Irregular menstruation, unspecified: Secondary | ICD-10-CM | POA: Diagnosis not present

## 2020-12-04 ENCOUNTER — Other Ambulatory Visit (HOSPITAL_COMMUNITY): Payer: Self-pay | Admitting: Obstetrics and Gynecology

## 2020-12-04 MED FILL — PROGESTERONE MICRONIZED 200: 200 | 30 days supply | Qty: 60 | Fill #0

## 2020-12-08 DIAGNOSIS — Z3189 Encounter for other procreative management: Secondary | ICD-10-CM | POA: Diagnosis not present

## 2020-12-14 MED FILL — SERTRALINE HCL 50 MG TABLET: 50 | 60 days supply | Qty: 180 | Fill #1

## 2021-01-07 ENCOUNTER — Ambulatory Visit (INDEPENDENT_AMBULATORY_CARE_PROVIDER_SITE_OTHER): Payer: 59 | Admitting: Neurology

## 2021-01-07 ENCOUNTER — Encounter: Payer: Self-pay | Admitting: Neurology

## 2021-01-07 ENCOUNTER — Other Ambulatory Visit: Payer: Self-pay

## 2021-01-07 VITALS — BP 128/83 | HR 101 | Ht 66.0 in | Wt 131.0 lb

## 2021-01-07 DIAGNOSIS — R253 Fasciculation: Secondary | ICD-10-CM

## 2021-01-07 NOTE — Patient Instructions (Addendum)
Nerve testing of the right arm and leg  ELECTROMYOGRAM AND NERVE CONDUCTION STUDIES (EMG/NCS) INSTRUCTIONS  How to Prepare The neurologist conducting the EMG will need to know if you have certain medical conditions. Tell the neurologist and other EMG lab personnel if you: Have a pacemaker or any other electrical medical device Take blood-thinning medications Have hemophilia, a blood-clotting disorder that causes prolonged bleeding Bathing Take a shower or bath shortly before your exam in order to remove oils from your skin. Don't apply lotions or creams before the exam.  What to Expect You'll likely be asked to change into a hospital gown for the procedure and lie down on an examination table. The following explanations can help you understand what will happen during the exam.  Electrodes. The neurologist or a technician places surface electrodes at various locations on your skin depending on where you're experiencing symptoms. Or the neurologist may insert needle electrodes at different sites depending on your symptoms.  Sensations. The electrodes will at times transmit a tiny electrical current that you may feel as a twinge or spasm. The needle electrode may cause discomfort or pain that usually ends shortly after the needle is removed. If you are concerned about discomfort or pain, you may want to talk to the neurologist about taking a short break during the exam.  Instructions. During the needle EMG, the neurologist will assess whether there is any spontaneous electrical activity when the muscle is at rest - activity that isn't present in healthy muscle tissue - and the degree of activity when you slightly contract the muscle.  He or she will give you instructions on resting and contracting a muscle at appropriate times. Depending on what muscles and nerves the neurologist is examining, he or she may ask you to change positions during the exam.  After your EMG You may experience some  temporary, minor bruising where the needle electrode was inserted into your muscle. This bruising should fade within several days. If it persists, contact your primary care doctor.   

## 2021-01-07 NOTE — Progress Notes (Signed)
    Follow-up Visit   Date: 01/07/21   Debra Craig MRN: [id number] DOB: Apr 08, 1986   Interim History: Debra Craig is a 35 y.o. Caucasian female with anxiety returning to the clinic for follow-up of muscle twitches.  The patient was accompanied to the clinic by self.  She continues to have episodic muscle twitches over the thighs, legs, and arms which has convinced her that she has ALS.  She is very nervous and anxious about this.  She notices these symptoms more when she is stressed/anxious, and admits that she is not aware of the twitches when distracted.  She denies weakness, difficulty swallowing, slurred speech, or falls. Previously, I discussed that her symptoms were due to overlap of inadequate sleep (post-partum) and anxiety.    Medications:  Current Outpatient Medications on File Prior to Visit  Medication Sig Dispense Refill  . Prenatal Vit-Fe Fumarate-FA (PRENATAL MULTIVITAMIN) TABS tablet Take 1 tablet by mouth daily at 12 noon.    . sertraline (ZOLOFT) 50 MG tablet 50 mg. Take 3 tablets daily     No current facility-administered medications on file prior to visit.    Allergies:  Allergies  Allergen Reactions  . Fish Allergy Nausea And Vomiting  . Penicillins     Did it involve swelling of the face/tongue/throat, SOB, or low BP? No Did it involve sudden or severe rash/hives, skin peeling, or any reaction on the inside of your mouth or nose? no Did you need to seek medical attention at a hospital or doctor's office? No  When did it last happen? If all above answers are "NO", may proceed with cephalosporin use.    Vital Signs:  BP 128/83   Pulse (!) 101   Ht 5\' 6"  (1.676 m)   Wt 131 lb (59.4 kg)   SpO2 100%   BMI 21.14 kg/m    Neurological Exam: MENTAL STATUS including orientation to time, place, person, recent and remote memory, attention span and concentration, language, and fund of knowledge is normal.  Speech is not dysarthric.  CRANIAL NERVES:   No visual field defects.  Pupils equal round and reactive to light.  Normal conjugate, extra-ocular eye movements in all directions of gaze.  No ptosis.  Face is symmetric. Palate elevates symmetrically.  Tongue is midline.  MOTOR:  Motor strength is 5/5 in all extremities.  No atrophy, fasciculations or abnormal movements.  No pronator drift.  Tone is normal.    MSRs:  Reflexes are 2+/4 throughout.  SENSORY:  Intact to vibration, pin prick, and temperature throughout.  COORDINATION/GAIT:  Normal finger-to- nose-finger.  Intact rapid alternating movements bilaterally.  Gait narrow based and stable.   Data: n/a  IMPRESSION/PLAN: Muscle twitches, patient reassured that her her exam is not suggestive of ALS and symptoms are anxiety/stress-driven. I discussed the role of electrodiagnostic testing and believe that the results will be more to reassure patient does not have anything worrisome.    Thank you for allowing me to participate in patient's care.  If I can answer any additional questions, I would be pleased to do so.    Sincerely,    Kellis Topete K. Posey Pronto, DO

## 2021-01-09 ENCOUNTER — Encounter: Payer: Self-pay | Admitting: Neurology

## 2021-01-14 DIAGNOSIS — E2839 Other primary ovarian failure: Secondary | ICD-10-CM | POA: Diagnosis not present

## 2021-01-14 DIAGNOSIS — N83291 Other ovarian cyst, right side: Secondary | ICD-10-CM | POA: Diagnosis not present

## 2021-01-14 DIAGNOSIS — N926 Irregular menstruation, unspecified: Secondary | ICD-10-CM | POA: Diagnosis not present

## 2021-01-21 DIAGNOSIS — N926 Irregular menstruation, unspecified: Secondary | ICD-10-CM | POA: Diagnosis not present

## 2021-01-21 DIAGNOSIS — N83291 Other ovarian cyst, right side: Secondary | ICD-10-CM | POA: Diagnosis not present

## 2021-01-21 DIAGNOSIS — E2839 Other primary ovarian failure: Secondary | ICD-10-CM | POA: Diagnosis not present

## 2021-01-28 ENCOUNTER — Ambulatory Visit: Payer: 59 | Admitting: Neurology

## 2021-01-30 DIAGNOSIS — Z1389 Encounter for screening for other disorder: Secondary | ICD-10-CM | POA: Diagnosis not present

## 2021-01-30 DIAGNOSIS — Z01419 Encounter for gynecological examination (general) (routine) without abnormal findings: Secondary | ICD-10-CM | POA: Diagnosis not present

## 2021-01-30 DIAGNOSIS — Z124 Encounter for screening for malignant neoplasm of cervix: Secondary | ICD-10-CM | POA: Diagnosis not present

## 2021-01-30 DIAGNOSIS — Z130 Encounter for screening for diseases of the blood and blood-forming organs and certain disorders involving the immune mechanism: Secondary | ICD-10-CM | POA: Diagnosis not present

## 2021-01-30 DIAGNOSIS — Z6821 Body mass index (BMI) 21.0-21.9, adult: Secondary | ICD-10-CM | POA: Diagnosis not present

## 2021-01-31 DIAGNOSIS — Z124 Encounter for screening for malignant neoplasm of cervix: Secondary | ICD-10-CM | POA: Diagnosis not present

## 2021-02-05 LAB — HM PAP SMEAR: HPV, high-risk: NEGATIVE

## 2021-02-08 DIAGNOSIS — E2839 Other primary ovarian failure: Secondary | ICD-10-CM | POA: Diagnosis not present

## 2021-02-08 DIAGNOSIS — N926 Irregular menstruation, unspecified: Secondary | ICD-10-CM | POA: Diagnosis not present

## 2021-02-08 DIAGNOSIS — N83291 Other ovarian cyst, right side: Secondary | ICD-10-CM | POA: Diagnosis not present

## 2021-02-08 DIAGNOSIS — Z319 Encounter for procreative management, unspecified: Secondary | ICD-10-CM | POA: Diagnosis not present

## 2021-02-14 DIAGNOSIS — E2839 Other primary ovarian failure: Secondary | ICD-10-CM | POA: Diagnosis not present

## 2021-02-14 DIAGNOSIS — Z319 Encounter for procreative management, unspecified: Secondary | ICD-10-CM | POA: Diagnosis not present

## 2021-02-14 DIAGNOSIS — N926 Irregular menstruation, unspecified: Secondary | ICD-10-CM | POA: Diagnosis not present

## 2021-02-14 DIAGNOSIS — N83291 Other ovarian cyst, right side: Secondary | ICD-10-CM | POA: Diagnosis not present

## 2021-02-19 ENCOUNTER — Encounter: Payer: 59 | Admitting: Neurology

## 2021-02-20 DIAGNOSIS — Z319 Encounter for procreative management, unspecified: Secondary | ICD-10-CM | POA: Diagnosis not present

## 2021-02-20 DIAGNOSIS — N926 Irregular menstruation, unspecified: Secondary | ICD-10-CM | POA: Diagnosis not present

## 2021-02-20 DIAGNOSIS — E2839 Other primary ovarian failure: Secondary | ICD-10-CM | POA: Diagnosis not present

## 2021-02-20 DIAGNOSIS — N83291 Other ovarian cyst, right side: Secondary | ICD-10-CM | POA: Diagnosis not present

## 2021-02-27 DIAGNOSIS — E2839 Other primary ovarian failure: Secondary | ICD-10-CM | POA: Diagnosis not present

## 2021-02-27 DIAGNOSIS — N926 Irregular menstruation, unspecified: Secondary | ICD-10-CM | POA: Diagnosis not present

## 2021-02-28 DIAGNOSIS — Z319 Encounter for procreative management, unspecified: Secondary | ICD-10-CM | POA: Diagnosis not present

## 2021-02-28 DIAGNOSIS — N83291 Other ovarian cyst, right side: Secondary | ICD-10-CM | POA: Diagnosis not present

## 2021-02-28 DIAGNOSIS — E2839 Other primary ovarian failure: Secondary | ICD-10-CM | POA: Diagnosis not present

## 2021-03-04 DIAGNOSIS — N926 Irregular menstruation, unspecified: Secondary | ICD-10-CM | POA: Diagnosis not present

## 2021-03-04 DIAGNOSIS — Z3189 Encounter for other procreative management: Secondary | ICD-10-CM | POA: Diagnosis not present

## 2021-03-04 DIAGNOSIS — E2839 Other primary ovarian failure: Secondary | ICD-10-CM | POA: Diagnosis not present

## 2021-03-05 MED FILL — Sertraline HCl Tab 50 MG: ORAL | 60 days supply | Qty: 180 | Fill #0 | Status: AC

## 2021-03-06 ENCOUNTER — Other Ambulatory Visit (HOSPITAL_COMMUNITY): Payer: Self-pay

## 2021-03-06 DIAGNOSIS — E2839 Other primary ovarian failure: Secondary | ICD-10-CM | POA: Diagnosis not present

## 2021-03-06 DIAGNOSIS — N926 Irregular menstruation, unspecified: Secondary | ICD-10-CM | POA: Diagnosis not present

## 2021-03-07 DIAGNOSIS — N926 Irregular menstruation, unspecified: Secondary | ICD-10-CM | POA: Diagnosis not present

## 2021-03-07 DIAGNOSIS — E2839 Other primary ovarian failure: Secondary | ICD-10-CM | POA: Diagnosis not present

## 2021-03-07 DIAGNOSIS — Z3189 Encounter for other procreative management: Secondary | ICD-10-CM | POA: Diagnosis not present

## 2021-03-11 ENCOUNTER — Ambulatory Visit: Payer: 59 | Admitting: Family Medicine

## 2021-03-18 DIAGNOSIS — R509 Fever, unspecified: Secondary | ICD-10-CM | POA: Diagnosis not present

## 2021-03-18 DIAGNOSIS — Z20822 Contact with and (suspected) exposure to covid-19: Secondary | ICD-10-CM | POA: Diagnosis not present

## 2021-03-21 DIAGNOSIS — N926 Irregular menstruation, unspecified: Secondary | ICD-10-CM | POA: Diagnosis not present

## 2021-03-21 DIAGNOSIS — Z3189 Encounter for other procreative management: Secondary | ICD-10-CM | POA: Diagnosis not present

## 2021-03-21 DIAGNOSIS — E2839 Other primary ovarian failure: Secondary | ICD-10-CM | POA: Diagnosis not present

## 2021-03-22 ENCOUNTER — Other Ambulatory Visit (HOSPITAL_COMMUNITY): Payer: Self-pay

## 2021-03-22 ENCOUNTER — Telehealth: Payer: 59 | Admitting: Physician Assistant

## 2021-03-22 DIAGNOSIS — B9689 Other specified bacterial agents as the cause of diseases classified elsewhere: Secondary | ICD-10-CM | POA: Diagnosis not present

## 2021-03-22 DIAGNOSIS — J019 Acute sinusitis, unspecified: Secondary | ICD-10-CM | POA: Diagnosis not present

## 2021-03-22 MED ORDER — DOXYCYCLINE HYCLATE 100 MG PO TABS: 100.0000 mg | ORAL_TABLET | Freq: Two times a day (BID) | ORAL | 0 refills | Status: DC

## 2021-03-22 MED FILL — Leuprolide Acetate Inj Kit 1 MG/0.2ML (5 MG/ML): INTRAMUSCULAR | 28 days supply | Qty: 1 | Fill #0 | Status: CN

## 2021-03-22 MED FILL — Leuprolide Acetate Inj Kit 1 MG/0.2ML (5 MG/ML): INTRAMUSCULAR | 21 days supply | Qty: 1 | Fill #0 | Status: CN

## 2021-03-22 NOTE — Progress Notes (Signed)
We are sorry that you are not feeling well.  Here is how we plan to help!  Based on what you have shared with me it looks like you have sinusitis.  Sinusitis is inflammation and infection in the sinus cavities of the head.  Based on your presentation I believe you most likely have Acute Bacterial Sinusitis.  This is an infection caused by bacteria and is treated with antibiotics. I have prescribed Doxycycline 100mg  by mouth twice a day for 10 days. (If breastfeeding, you need to pump and dump the milk while on this antibiotic or let us know so we can change). You may use an oral decongestant such as Mucinex D or if you have glaucoma or high blood pressure use plain Mucinex. Saline nasal spray help and can safely be used as often as needed for congestion.  If you develop worsening sinus pain, fever or notice severe headache and vision changes, or if symptoms are not better after completion of antibiotic, please schedule an appointment with a health care provider.    Sinus infections are not as easily transmitted as other respiratory infection, however we still recommend that you avoid close contact with loved ones, especially the very young and elderly.  Remember to wash your hands thoroughly throughout the day as this is the number one way to prevent the spread of infection!  Home Care:  Only take medications as instructed by your medical team.  Complete the entire course of an antibiotic.  Do not take these medications with alcohol.  A steam or ultrasonic humidifier can help congestion.  You can place a towel over your head and breathe in the steam from hot water coming from a faucet.  Avoid close contacts especially the very young and the elderly.  Cover your mouth when you cough or sneeze.  Always remember to wash your hands.  Get Help Right Away If:  You develop worsening fever or sinus pain.  You develop a severe head ache or visual changes.  Your symptoms persist after you have  completed your treatment plan.  Make sure you  Understand these instructions.  Will watch your condition.  Will get help right away if you are not doing well or get worse.  Your e-visit answers were reviewed by a board certified advanced clinical practitioner to complete your personal care plan.  Depending on the condition, your plan could have included both over the counter or prescription medications.  If there is a problem please reply  once you have received a response from your provider.  Your safety is important to [place].  If you have drug allergies check your prescription carefully.    You can use MyChart to ask questions about today's visit, request a non-urgent call back, or ask for a work or school excuse for 24 hours related to this e-Visit. If it has been greater than 24 hours you will need to follow up with your provider, or enter a new e-Visit to address those concerns.  You will get an e-mail in the next two days asking about your experience.  I hope that your e-visit has been valuable and will speed your recovery. Thank you for using e-visits.

## 2021-03-22 NOTE — Progress Notes (Signed)
I have spent 5 minutes in review of e-visit questionnaire, review and updating patient chart, medical decision making and response to patient.   Dayvon Dax Cody Alaa Eyerman, PA-C    

## 2021-03-25 ENCOUNTER — Other Ambulatory Visit (HOSPITAL_COMMUNITY): Payer: Self-pay

## 2021-03-26 ENCOUNTER — Other Ambulatory Visit (HOSPITAL_COMMUNITY): Payer: Self-pay

## 2021-03-26 DIAGNOSIS — E2839 Other primary ovarian failure: Secondary | ICD-10-CM | POA: Diagnosis not present

## 2021-03-26 DIAGNOSIS — Z3189 Encounter for other procreative management: Secondary | ICD-10-CM | POA: Diagnosis not present

## 2021-03-26 DIAGNOSIS — N926 Irregular menstruation, unspecified: Secondary | ICD-10-CM | POA: Diagnosis not present

## 2021-03-27 ENCOUNTER — Other Ambulatory Visit (HOSPITAL_COMMUNITY): Payer: Self-pay

## 2021-03-27 DIAGNOSIS — E2839 Other primary ovarian failure: Secondary | ICD-10-CM | POA: Diagnosis not present

## 2021-03-27 DIAGNOSIS — N926 Irregular menstruation, unspecified: Secondary | ICD-10-CM | POA: Diagnosis not present

## 2021-03-27 MED ORDER — DOXYCYCLINE HYCLATE 100 MG PO TABS
200.0000 mg | ORAL_TABLET | Freq: Every day | ORAL | 0 refills | Status: DC
  Filled 2021-03-27: qty 10, 4d supply, fill #0
  Filled 2021-03-29: qty 10, 5d supply, fill #0

## 2021-03-27 MED ORDER — TRAMADOL HCL 50 MG PO TABS
50.0000 mg | ORAL_TABLET | Freq: Every day | ORAL | 0 refills | Status: DC
  Filled 2021-03-27: qty 1, 1d supply, fill #0

## 2021-03-27 MED ORDER — DIAZEPAM 10 MG PO TABS
10.0000 mg | ORAL_TABLET | Freq: Every day | ORAL | 0 refills | Status: DC
  Filled 2021-03-27: qty 1, 1d supply, fill #0

## 2021-03-28 ENCOUNTER — Other Ambulatory Visit (HOSPITAL_COMMUNITY): Payer: Self-pay

## 2021-03-28 DIAGNOSIS — E2839 Other primary ovarian failure: Secondary | ICD-10-CM | POA: Diagnosis not present

## 2021-03-28 DIAGNOSIS — Z3189 Encounter for other procreative management: Secondary | ICD-10-CM | POA: Diagnosis not present

## 2021-03-28 MED FILL — Progesterone Cap 200 MG: ORAL | 30 days supply | Qty: 60 | Fill #0 | Status: AC

## 2021-03-29 ENCOUNTER — Other Ambulatory Visit (HOSPITAL_COMMUNITY): Payer: Self-pay

## 2021-04-01 ENCOUNTER — Other Ambulatory Visit (HOSPITAL_COMMUNITY): Payer: Self-pay

## 2021-04-02 ENCOUNTER — Other Ambulatory Visit (HOSPITAL_COMMUNITY): Payer: Self-pay

## 2021-04-02 DIAGNOSIS — L988 Other specified disorders of the skin and subcutaneous tissue: Secondary | ICD-10-CM | POA: Diagnosis not present

## 2021-04-03 ENCOUNTER — Other Ambulatory Visit (HOSPITAL_COMMUNITY): Payer: Self-pay

## 2021-04-09 ENCOUNTER — Other Ambulatory Visit (HOSPITAL_COMMUNITY): Payer: Self-pay

## 2021-04-16 DIAGNOSIS — E2839 Other primary ovarian failure: Secondary | ICD-10-CM | POA: Diagnosis not present

## 2021-04-16 DIAGNOSIS — N926 Irregular menstruation, unspecified: Secondary | ICD-10-CM | POA: Diagnosis not present

## 2021-04-16 DIAGNOSIS — Z113 Encounter for screening for infections with a predominantly sexual mode of transmission: Secondary | ICD-10-CM | POA: Diagnosis not present

## 2021-04-16 DIAGNOSIS — Z3200 Encounter for pregnancy test, result unknown: Secondary | ICD-10-CM | POA: Diagnosis not present

## 2021-05-01 DIAGNOSIS — Z3183 Encounter for assisted reproductive fertility procedure cycle: Secondary | ICD-10-CM | POA: Diagnosis not present

## 2021-05-01 DIAGNOSIS — Z113 Encounter for screening for infections with a predominantly sexual mode of transmission: Secondary | ICD-10-CM | POA: Diagnosis not present

## 2021-05-01 DIAGNOSIS — N926 Irregular menstruation, unspecified: Secondary | ICD-10-CM | POA: Diagnosis not present

## 2021-05-01 DIAGNOSIS — E2839 Other primary ovarian failure: Secondary | ICD-10-CM | POA: Diagnosis not present

## 2021-05-01 DIAGNOSIS — Z319 Encounter for procreative management, unspecified: Secondary | ICD-10-CM | POA: Diagnosis not present

## 2021-05-21 DIAGNOSIS — Z113 Encounter for screening for infections with a predominantly sexual mode of transmission: Secondary | ICD-10-CM | POA: Diagnosis not present

## 2021-05-21 DIAGNOSIS — E2839 Other primary ovarian failure: Secondary | ICD-10-CM | POA: Diagnosis not present

## 2021-05-21 DIAGNOSIS — Z319 Encounter for procreative management, unspecified: Secondary | ICD-10-CM | POA: Diagnosis not present

## 2021-05-21 DIAGNOSIS — Z3183 Encounter for assisted reproductive fertility procedure cycle: Secondary | ICD-10-CM | POA: Diagnosis not present

## 2021-05-24 DIAGNOSIS — E2839 Other primary ovarian failure: Secondary | ICD-10-CM | POA: Diagnosis not present

## 2021-05-24 DIAGNOSIS — Z3183 Encounter for assisted reproductive fertility procedure cycle: Secondary | ICD-10-CM | POA: Diagnosis not present

## 2021-05-24 DIAGNOSIS — Z113 Encounter for screening for infections with a predominantly sexual mode of transmission: Secondary | ICD-10-CM | POA: Diagnosis not present

## 2021-05-24 DIAGNOSIS — Z319 Encounter for procreative management, unspecified: Secondary | ICD-10-CM | POA: Diagnosis not present

## 2021-05-28 DIAGNOSIS — Z3183 Encounter for assisted reproductive fertility procedure cycle: Secondary | ICD-10-CM | POA: Diagnosis not present

## 2021-05-28 DIAGNOSIS — Z3200 Encounter for pregnancy test, result unknown: Secondary | ICD-10-CM | POA: Diagnosis not present

## 2021-05-28 DIAGNOSIS — Z113 Encounter for screening for infections with a predominantly sexual mode of transmission: Secondary | ICD-10-CM | POA: Diagnosis not present

## 2021-05-28 DIAGNOSIS — E2839 Other primary ovarian failure: Secondary | ICD-10-CM | POA: Diagnosis not present

## 2021-05-28 DIAGNOSIS — N926 Irregular menstruation, unspecified: Secondary | ICD-10-CM | POA: Diagnosis not present

## 2021-05-29 ENCOUNTER — Other Ambulatory Visit (HOSPITAL_COMMUNITY): Payer: Self-pay

## 2021-05-29 DIAGNOSIS — E2839 Other primary ovarian failure: Secondary | ICD-10-CM | POA: Diagnosis not present

## 2021-05-29 DIAGNOSIS — Z319 Encounter for procreative management, unspecified: Secondary | ICD-10-CM | POA: Diagnosis not present

## 2021-05-29 MED ORDER — "NEEDLE (DISP) 30G X 1/2"" MISC"
2 refills | Status: DC
  Filled 2021-05-29 – 2021-06-17 (×2): qty 30, 30d supply, fill #0

## 2021-05-29 MED ORDER — ESTRADIOL 0.1 MG/24HR TD PTTW
2.0000 | MEDICATED_PATCH | TRANSDERMAL | 3 refills | Status: DC
  Filled 2021-05-29: qty 16, 24d supply, fill #0

## 2021-05-29 MED ORDER — CETROTIDE 0.25 MG ~~LOC~~ KIT
PACK | SUBCUTANEOUS | 1 refills | Status: DC
  Filled 2021-05-29 – 2021-07-19 (×2): qty 6, 6d supply, fill #0

## 2021-05-29 MED ORDER — MENOTROPINS 75 UNITS ~~LOC~~ SOLR
75.0000 [IU] | Freq: Every day | SUBCUTANEOUS | 1 refills | Status: DC
  Filled 2021-05-29 – 2021-06-14 (×2): qty 15, 15d supply, fill #0

## 2021-05-29 MED ORDER — "SYRINGE/NEEDLE (DISP) 18G X 1-1/2"" 3 ML MISC"
2 refills | Status: DC
  Filled 2021-05-29: qty 30, 30d supply, fill #0
  Filled 2021-06-17: qty 30, 15d supply, fill #0

## 2021-05-29 MED ORDER — CHORIONIC GONADOTROPIN 10000 UNITS IM SOLR
INTRAMUSCULAR | 1 refills | Status: DC
  Filled 2021-05-29: qty 2, 1d supply, fill #0
  Filled 2021-05-29: qty 1, 1d supply, fill #0

## 2021-05-29 MED ORDER — FOLLITROPIN ALFA 900 UNIT/1.5ML ~~LOC~~ SOPN
300.0000 [IU] | PEN_INJECTOR | Freq: Every day | SUBCUTANEOUS | 1 refills | Status: DC
  Filled 2021-05-29: qty 6, 4d supply, fill #0
  Filled 2021-06-14: qty 6, 6d supply, fill #0

## 2021-05-29 MED ORDER — CHORIONIC GONADOTROPIN 10000 UNITS IM SOLR
INTRAMUSCULAR | 1 refills | Status: DC
  Filled 2021-05-29 (×2): qty 1, 1d supply, fill #0

## 2021-05-29 MED ORDER — DOXYCYCLINE MONOHYDRATE 100 MG PO TABS
100.0000 mg | ORAL_TABLET | Freq: Two times a day (BID) | ORAL | 0 refills | Status: DC
  Filled 2021-05-29 – 2021-06-14 (×2): qty 20, 10d supply, fill #0

## 2021-05-29 MED ORDER — CHORIONIC GONADOTROPIN 10000 UNITS IM SOLR
INTRAMUSCULAR | 1 refills | Status: DC
  Filled 2021-05-29: qty 1, 1d supply, fill #0

## 2021-05-30 ENCOUNTER — Other Ambulatory Visit (HOSPITAL_COMMUNITY): Payer: Self-pay

## 2021-05-30 MED ORDER — ESTRADIOL 0.1 MG/24HR TD PTTW
1.0000 | MEDICATED_PATCH | TRANSDERMAL | 3 refills | Status: DC
  Filled 2021-05-30 – 2021-06-04 (×2): qty 8, 28d supply, fill #0
  Filled 2021-06-24: qty 8, 28d supply, fill #1

## 2021-05-31 DIAGNOSIS — M25561 Pain in right knee: Secondary | ICD-10-CM | POA: Diagnosis not present

## 2021-06-04 ENCOUNTER — Other Ambulatory Visit (HOSPITAL_COMMUNITY): Payer: Self-pay

## 2021-06-06 ENCOUNTER — Other Ambulatory Visit (HOSPITAL_COMMUNITY): Payer: Self-pay

## 2021-06-14 ENCOUNTER — Other Ambulatory Visit (HOSPITAL_COMMUNITY): Payer: Self-pay

## 2021-06-14 DIAGNOSIS — Z3183 Encounter for assisted reproductive fertility procedure cycle: Secondary | ICD-10-CM | POA: Diagnosis not present

## 2021-06-14 DIAGNOSIS — E2839 Other primary ovarian failure: Secondary | ICD-10-CM | POA: Diagnosis not present

## 2021-06-14 DIAGNOSIS — N926 Irregular menstruation, unspecified: Secondary | ICD-10-CM | POA: Diagnosis not present

## 2021-06-17 ENCOUNTER — Other Ambulatory Visit (HOSPITAL_COMMUNITY): Payer: Self-pay

## 2021-06-17 DIAGNOSIS — M25561 Pain in right knee: Secondary | ICD-10-CM | POA: Diagnosis not present

## 2021-06-17 DIAGNOSIS — M6281 Muscle weakness (generalized): Secondary | ICD-10-CM | POA: Diagnosis not present

## 2021-06-17 DIAGNOSIS — M6751 Plica syndrome, right knee: Secondary | ICD-10-CM | POA: Diagnosis not present

## 2021-06-17 MED FILL — Sertraline HCl Tab 50 MG: ORAL | 60 days supply | Qty: 180 | Fill #1 | Status: CN

## 2021-06-18 ENCOUNTER — Other Ambulatory Visit (HOSPITAL_COMMUNITY): Payer: Self-pay

## 2021-06-18 DIAGNOSIS — N979 Female infertility, unspecified: Secondary | ICD-10-CM | POA: Diagnosis not present

## 2021-06-18 DIAGNOSIS — N926 Irregular menstruation, unspecified: Secondary | ICD-10-CM | POA: Diagnosis not present

## 2021-06-18 DIAGNOSIS — Z3183 Encounter for assisted reproductive fertility procedure cycle: Secondary | ICD-10-CM | POA: Diagnosis not present

## 2021-06-18 DIAGNOSIS — E2839 Other primary ovarian failure: Secondary | ICD-10-CM | POA: Diagnosis not present

## 2021-06-18 MED FILL — Sertraline HCl Tab 50 MG: ORAL | 60 days supply | Qty: 180 | Fill #1 | Status: AC

## 2021-06-19 ENCOUNTER — Other Ambulatory Visit (HOSPITAL_BASED_OUTPATIENT_CLINIC_OR_DEPARTMENT_OTHER): Payer: Self-pay

## 2021-06-19 ENCOUNTER — Ambulatory Visit: Payer: 59

## 2021-06-19 DIAGNOSIS — E2839 Other primary ovarian failure: Secondary | ICD-10-CM | POA: Diagnosis not present

## 2021-06-19 DIAGNOSIS — Z319 Encounter for procreative management, unspecified: Secondary | ICD-10-CM | POA: Diagnosis not present

## 2021-06-19 DIAGNOSIS — Z3183 Encounter for assisted reproductive fertility procedure cycle: Secondary | ICD-10-CM | POA: Diagnosis not present

## 2021-06-19 DIAGNOSIS — N926 Irregular menstruation, unspecified: Secondary | ICD-10-CM | POA: Diagnosis not present

## 2021-06-21 ENCOUNTER — Other Ambulatory Visit (HOSPITAL_COMMUNITY): Payer: Self-pay

## 2021-06-24 ENCOUNTER — Other Ambulatory Visit (HOSPITAL_COMMUNITY): Payer: Self-pay

## 2021-06-24 MED ORDER — ORILISSA 200 MG PO TABS
1.0000 | ORAL_TABLET | Freq: Two times a day (BID) | ORAL | 3 refills | Status: DC
  Filled 2021-06-24 – 2021-06-27 (×3): qty 56, 28d supply, fill #0

## 2021-06-25 ENCOUNTER — Other Ambulatory Visit (HOSPITAL_COMMUNITY): Payer: Self-pay

## 2021-06-26 ENCOUNTER — Other Ambulatory Visit (HOSPITAL_COMMUNITY): Payer: Self-pay

## 2021-06-27 ENCOUNTER — Other Ambulatory Visit (HOSPITAL_COMMUNITY): Payer: Self-pay

## 2021-07-10 ENCOUNTER — Other Ambulatory Visit (HOSPITAL_COMMUNITY): Payer: Self-pay

## 2021-07-10 DIAGNOSIS — N912 Amenorrhea, unspecified: Secondary | ICD-10-CM | POA: Diagnosis not present

## 2021-07-16 DIAGNOSIS — N912 Amenorrhea, unspecified: Secondary | ICD-10-CM | POA: Diagnosis not present

## 2021-07-16 DIAGNOSIS — Z3183 Encounter for assisted reproductive fertility procedure cycle: Secondary | ICD-10-CM | POA: Diagnosis not present

## 2021-07-16 DIAGNOSIS — E2839 Other primary ovarian failure: Secondary | ICD-10-CM | POA: Diagnosis not present

## 2021-07-16 DIAGNOSIS — N926 Irregular menstruation, unspecified: Secondary | ICD-10-CM | POA: Diagnosis not present

## 2021-07-17 ENCOUNTER — Other Ambulatory Visit (HOSPITAL_COMMUNITY): Payer: Self-pay

## 2021-07-17 MED ORDER — CARESTART COVID-19 HOME TEST VI KIT
PACK | 0 refills | Status: DC
Start: 2021-07-16 — End: 2021-08-28
  Filled 2021-07-17: qty 2, 2d supply, fill #0

## 2021-07-19 ENCOUNTER — Other Ambulatory Visit (HOSPITAL_COMMUNITY): Payer: Self-pay

## 2021-07-20 DIAGNOSIS — N979 Female infertility, unspecified: Secondary | ICD-10-CM | POA: Diagnosis not present

## 2021-07-20 DIAGNOSIS — Z3183 Encounter for assisted reproductive fertility procedure cycle: Secondary | ICD-10-CM | POA: Diagnosis not present

## 2021-07-24 DIAGNOSIS — Z3183 Encounter for assisted reproductive fertility procedure cycle: Secondary | ICD-10-CM | POA: Diagnosis not present

## 2021-07-25 ENCOUNTER — Other Ambulatory Visit (HOSPITAL_COMMUNITY): Payer: Self-pay

## 2021-08-07 DIAGNOSIS — Z760 Encounter for issue of repeat prescription: Secondary | ICD-10-CM | POA: Diagnosis not present

## 2021-08-22 ENCOUNTER — Other Ambulatory Visit (HOSPITAL_COMMUNITY): Payer: Self-pay

## 2021-08-22 MED ORDER — PROGESTERONE 50 MG/ML IM OIL
50.0000 mg | TOPICAL_OIL | Freq: Every day | INTRAMUSCULAR | 2 refills | Status: DC
  Filled 2021-08-22: qty 30, 30d supply, fill #0
  Filled 2022-06-13: qty 30, 30d supply, fill #1

## 2021-08-22 MED ORDER — "BD LUER-LOK SYRINGE 18G X 1-1/2"" 3 ML MISC"
2 refills | Status: DC
  Filled 2021-08-22: qty 30, 30d supply, fill #0
  Filled 2022-06-13: qty 30, 30d supply, fill #1

## 2021-08-22 MED ORDER — METHYLPREDNISOLONE 8 MG PO TABS
8.0000 mg | ORAL_TABLET | Freq: Two times a day (BID) | ORAL | 0 refills | Status: DC
  Filled 2021-08-22: qty 8, 4d supply, fill #0

## 2021-08-22 MED ORDER — "BD HYPODERMIC NEEDLE 22G X 1-1/2"" MISC"
2 refills | Status: DC
  Filled 2021-08-22: qty 30, 30d supply, fill #0
  Filled 2022-06-13 – 2022-06-17 (×2): qty 30, 30d supply, fill #1

## 2021-08-22 MED ORDER — ESTRADIOL 2 MG PO TABS
2.0000 mg | ORAL_TABLET | Freq: Two times a day (BID) | ORAL | 3 refills | Status: DC
  Filled 2021-08-22: qty 60, 30d supply, fill #0
  Filled 2021-10-22: qty 60, 30d supply, fill #1
  Filled 2022-04-13: qty 60, 30d supply, fill #2
  Filled 2022-05-30: qty 60, 30d supply, fill #3

## 2021-08-22 MED ORDER — ESTRADIOL 0.1 MG/24HR TD PTTW
1.0000 | MEDICATED_PATCH | TRANSDERMAL | 3 refills | Status: DC
  Filled 2021-08-22: qty 8, 28d supply, fill #0
  Filled 2021-10-22: qty 8, 28d supply, fill #1

## 2021-08-23 ENCOUNTER — Other Ambulatory Visit (HOSPITAL_COMMUNITY): Payer: Self-pay

## 2021-08-23 DIAGNOSIS — N8500 Endometrial hyperplasia, unspecified: Secondary | ICD-10-CM | POA: Diagnosis not present

## 2021-08-23 DIAGNOSIS — Z3141 Encounter for fertility testing: Secondary | ICD-10-CM | POA: Diagnosis not present

## 2021-08-23 MED ORDER — DOXYCYCLINE HYCLATE 100 MG PO CAPS
100.0000 mg | ORAL_CAPSULE | Freq: Two times a day (BID) | ORAL | 0 refills | Status: DC
  Filled 2021-08-23: qty 10, 5d supply, fill #0

## 2021-08-26 ENCOUNTER — Other Ambulatory Visit (HOSPITAL_COMMUNITY): Payer: Self-pay

## 2021-08-26 DIAGNOSIS — M25571 Pain in right ankle and joints of right foot: Secondary | ICD-10-CM | POA: Diagnosis not present

## 2021-08-26 MED ORDER — MELOXICAM 7.5 MG PO TABS
7.5000 mg | ORAL_TABLET | Freq: Two times a day (BID) | ORAL | 2 refills | Status: DC
  Filled 2021-08-26: qty 60, 30d supply, fill #0

## 2021-08-28 ENCOUNTER — Other Ambulatory Visit: Payer: Self-pay

## 2021-08-28 ENCOUNTER — Encounter (HOSPITAL_BASED_OUTPATIENT_CLINIC_OR_DEPARTMENT_OTHER): Payer: Self-pay | Admitting: Orthopaedic Surgery

## 2021-08-29 NOTE — H&P (Signed)
PREOPERATIVE H&P  Chief Complaint: left knee PLICA syndrome, medial menicus injury.  HPI: Debra Craig is a 35 y.o. female who is scheduled for, Procedure(s): KNEE ARTHROSCOPY WITH MEDIAL MENISECTOMY KNEE ARTHROSCOPY WITH EXCISION PLICA.   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 She has had right knee pain for a few months now that happened when she was  playing with her daughter and she felt a pain in her right knee. She now is having a lot of clicking, popping and catching. She had an injection which helped with her pain but not mechanical symptoms. She has tried rest and anti-inflammatories without any improvement.   Her symptoms are rated as moderate to severe, and have been worsening.  This is significantly impairing activities of daily living.    Please see clinic note for further details on this patient's care.    She has elected for surgical management.   Past Medical History:  Diagnosis Date   Acne    Anxiety    Elevated LDL cholesterol level    Liver hemangioma    Muscle twitching    Vitamin D deficiency    Past Surgical History:  Procedure Laterality Date   IUI     vaginal delievery     WISDOM TOOTH EXTRACTION     Social History   Socioeconomic History   Marital status: Single    Spouse name: Not on file   Number of children: 1   Years of education: Not on file   Highest education level: Not on file  Occupational History   Occupation: Nurse anestetist  Tobacco Use   Smoking status: Never   Smokeless tobacco: Never  Vaping Use   Vaping Use: Never used  Substance and Sexual Activity   Alcohol use: Yes    Comment: Social Drinking   Drug use: No   Sexual activity: Yes  Other Topics Concern   Not on file  Social History Narrative   Right Handed   2 story home   Drinks no caffeine    Social Determinants of Health   Financial Resource Strain: Not on file  Food Insecurity: Not on file  Transportation Needs: Not on file   Physical Activity: Not on file  Stress: Not on file  Social Connections: Not on file   History reviewed. No pertinent family history. Allergies  Allergen Reactions   Penicillins     Did it involve swelling of the face/tongue/throat, SOB, or low BP? No Did it involve sudden or severe rash/hives, skin peeling, or any reaction on the inside of your mouth or nose? no Did you need to seek medical attention at a hospital or doctor's office? No  When did it last happen?       If all above answers are "NO", may proceed with cephalosporin use.   Prior to Admission medications   Medication Sig Start Date End Date Taking? Authorizing Provider  meloxicam (MOBIC) 7.5 MG tablet Take 1 tablet by mouth twice a day 08/26/21  Yes   doxycycline (VIBRAMYCIN) 100 MG capsule Take 1 capsule (100 mg total) by mouth 2 (two) times daily. 08/23/21     estradiol (ESTRACE) 2 MG tablet Take 1 tablet (2 mg total) by mouth 2 (two) times daily. 08/22/21     estradiol (VIVELLE-DOT) 0.1 MG/24HR patch Apply 1 patch every 3 days 08/22/21     methylPREDNISolone (MEDROL) 8 MG tablet Take 1 tablet (8 mg total) by mouth 2 (two)  times daily. 08/22/21     NEEDLE, DISP, 22 G (B-D HYPODERMIC NEEDLE 22GX1.5") 22G X 1-1/2" MISC use to inject Progesterone 08/22/21     Prenatal Vit-Fe Fumarate-FA (PRENATAL MULTIVITAMIN) TABS tablet Take 1 tablet by mouth daily at 12 noon.    [provider]  progesterone 50 MG/ML injection Inject 1 mL (50 mg total) into the muscle daily. 08/22/21     sertraline (ZOLOFT) 50 MG tablet 50 mg. Take 3 tablets daily 12/22/19   [provider]  sertraline (ZOLOFT) 50 MG tablet TAKE 3 TABLETS BY MOUTH DAILY 08/09/20 08/20/21  Paula Compton, MD  SYRINGE-NEEDLE, DISP, 3 ML (B-D 3CC LUER-LOK SYR 18GX1-1/2) 18G X 1-1/2" 3 ML MISC Use with Progesterone injections 08/22/21       ROS: All other systems have been reviewed and were otherwise negative with the exception of those mentioned in the HPI and as  above.  Physical Exam: General: Alert, no acute distress Cardiovascular: No pedal edema Respiratory: No cyanosis, no use of accessory musculature GI: No organomegaly, abdomen is soft and non-tender Skin: No lesions in the area of chief complaint Neurologic: Sensation intact distally Psychiatric: Patient is competent for consent with normal mood and affect Lymphatic: No axillary or cervical lymphadenopathy  MUSCULOSKELETAL:  On examination she has a full range of motion of the right knee as compared to the contralateral side. She is tender to palpation about the medial joint line. Positive McMurray's.  Good endpoint on Lachman's.  Distal motor and sensory function are intact.   Imaging: MRI demonstrated no obvious meniscus tear  Assessment: left knee PLICA syndrome, medial menicus injury.  Plan: Plan for Procedure(s): KNEE ARTHROSCOPY WITH MEDIAL MENISECTOMY KNEE ARTHROSCOPY WITH EXCISION PLICA  The risks benefits and alternatives were discussed with the patient including but not limited to the risks of nonoperative treatment, versus surgical intervention including infection, bleeding, nerve injury,  blood clots, cardiopulmonary complications, morbidity, mortality, among others, and they were willing to proceed.   The patient acknowledged the explanation, agreed to proceed with the plan and consent was signed.   Operative Plan: Right knee scope with chondroplasty, debridement, possible plica excision, possible meniscectomy Discharge Medications: Standard DVT Prophylaxis: Aspirin Physical Therapy: Outpatient PT Special Discharge needs: +/-   Ethelda Chick, PA-C  08/29/2021 11:44 AM

## 2021-09-05 ENCOUNTER — Encounter (HOSPITAL_BASED_OUTPATIENT_CLINIC_OR_DEPARTMENT_OTHER)
Admission: RE | Disposition: A | Discharge status: Home / Self Care | Payer: Self-pay | Source: Home / Self Care | Attending: Orthopaedic Surgery

## 2021-09-05 ENCOUNTER — Other Ambulatory Visit (HOSPITAL_COMMUNITY): Payer: Self-pay

## 2021-09-05 ENCOUNTER — Other Ambulatory Visit: Payer: Self-pay

## 2021-09-05 ENCOUNTER — Encounter (HOSPITAL_BASED_OUTPATIENT_CLINIC_OR_DEPARTMENT_OTHER): Payer: Self-pay | Admitting: Orthopaedic Surgery

## 2021-09-05 ENCOUNTER — Ambulatory Visit (HOSPITAL_BASED_OUTPATIENT_CLINIC_OR_DEPARTMENT_OTHER): Payer: 59 | Admitting: Anesthesiology

## 2021-09-05 ENCOUNTER — Ambulatory Visit (HOSPITAL_BASED_OUTPATIENT_CLINIC_OR_DEPARTMENT_OTHER)
Admission: RE | Admit: 2021-09-05 | Discharge: 2021-09-05 | Disposition: A | Discharge status: Home / Self Care | Payer: 59 | Attending: Orthopaedic Surgery | Admitting: Orthopaedic Surgery

## 2021-09-05 DIAGNOSIS — M6752 Plica syndrome, left knee: Secondary | ICD-10-CM | POA: Diagnosis not present

## 2021-09-05 DIAGNOSIS — Z880 Allergy status to penicillin: Secondary | ICD-10-CM | POA: Insufficient documentation

## 2021-09-05 DIAGNOSIS — M6751 Plica syndrome, right knee: Secondary | ICD-10-CM | POA: Diagnosis not present

## 2021-09-05 DIAGNOSIS — Z79899 Other long term (current) drug therapy: Secondary | ICD-10-CM | POA: Diagnosis not present

## 2021-09-05 DIAGNOSIS — E559 Vitamin D deficiency, unspecified: Secondary | ICD-10-CM | POA: Diagnosis not present

## 2021-09-05 DIAGNOSIS — F419 Anxiety disorder, unspecified: Secondary | ICD-10-CM | POA: Diagnosis not present

## 2021-09-05 HISTORY — PX: KNEE ARTHROSCOPY WITH EXCISION PLICA: SHX5647

## 2021-09-05 LAB — POCT PREGNANCY, URINE: Preg Test, Ur: NEGATIVE

## 2021-09-05 SURGERY — ARTHROSCOPY, KNEE, WITH PLICA EXCISION
Anesthesia: General | Site: Knee | Laterality: Right

## 2021-09-05 MED ORDER — CEFAZOLIN SODIUM-DEXTROSE 2-4 GM/100ML-% IV SOLN
2.0000 g | INTRAVENOUS | Status: AC
  Administered 2021-09-05: 2 g via INTRAVENOUS

## 2021-09-05 MED ORDER — FENTANYL CITRATE (PF) 100 MCG/2ML IJ SOLN
25.0000 ug | INTRAMUSCULAR | Status: DC | PRN
  Administered 2021-09-05 (×2): 50 ug via INTRAVENOUS

## 2021-09-05 MED ORDER — ACETAMINOPHEN 500 MG PO TABS
1000.0000 mg | ORAL_TABLET | Freq: Three times a day (TID) | ORAL | 0 refills | Status: AC
  Filled 2021-09-05: qty 84, 14d supply, fill #0

## 2021-09-05 MED ORDER — SERTRALINE HCL 50 MG PO TABS
150.0000 mg | ORAL_TABLET | Freq: Every day | ORAL | 3 refills | Status: DC
  Filled 2021-09-05 – 2021-09-16 (×2): qty 90, 30d supply, fill #0
  Filled 2021-10-16: qty 90, 30d supply, fill #1
  Filled 2021-11-21: qty 90, 30d supply, fill #2
  Filled 2021-12-23: qty 90, 30d supply, fill #3

## 2021-09-05 MED ORDER — PROPOFOL 500 MG/50ML IV EMUL
INTRAVENOUS | Status: DC | PRN
  Administered 2021-09-05: 2 ug/kg/min via INTRAVENOUS

## 2021-09-05 MED ORDER — SODIUM CHLORIDE 0.9 % IR SOLN
Status: DC | PRN
  Administered 2021-09-05: 2000 mL

## 2021-09-05 MED ORDER — ACETAMINOPHEN 500 MG PO TABS
ORAL_TABLET | ORAL | Status: AC
  Filled 2021-09-05: qty 2

## 2021-09-05 MED ORDER — FENTANYL CITRATE (PF) 100 MCG/2ML IJ SOLN
INTRAMUSCULAR | Status: AC
  Filled 2021-09-05: qty 2

## 2021-09-05 MED ORDER — KETOROLAC TROMETHAMINE 30 MG/ML IJ SOLN
INTRAMUSCULAR | Status: DC | PRN
  Administered 2021-09-05: 30 mg via INTRAVENOUS

## 2021-09-05 MED ORDER — DEXMEDETOMIDINE (PRECEDEX) IN NS 20 MCG/5ML (4 MCG/ML) IV SYRINGE
PREFILLED_SYRINGE | INTRAVENOUS | Status: DC | PRN
  Administered 2021-09-05: 8 ug via INTRAVENOUS

## 2021-09-05 MED ORDER — PROMETHAZINE HCL 25 MG/ML IJ SOLN: 6.2500 mg | INTRAMUSCULAR | Status: DC | PRN

## 2021-09-05 MED ORDER — OXYCODONE HCL 5 MG/5ML PO SOLN: 5.0000 mg | Freq: Once | ORAL | Status: DC | PRN

## 2021-09-05 MED ORDER — ASPIRIN 81 MG PO CHEW
81.0000 mg | CHEWABLE_TABLET | Freq: Two times a day (BID) | ORAL | 0 refills | Status: AC
  Filled 2021-09-05: qty 84, 42d supply, fill #0

## 2021-09-05 MED ORDER — ONDANSETRON HCL 4 MG PO TABS
4.0000 mg | ORAL_TABLET | Freq: Three times a day (TID) | ORAL | 0 refills | Status: AC | PRN
  Filled 2021-09-05: qty 10, 7d supply, fill #0

## 2021-09-05 MED ORDER — AMISULPRIDE (ANTIEMETIC) 5 MG/2ML IV SOLN
10.0000 mg | Freq: Once | INTRAVENOUS | Status: DC | PRN
Start: 2021-09-05 — End: 2021-09-05

## 2021-09-05 MED ORDER — SCOPOLAMINE 1 MG/3DAYS TD PT72
1.0000 | MEDICATED_PATCH | TRANSDERMAL | Status: DC
  Administered 2021-09-05: 1.5 mg via TRANSDERMAL

## 2021-09-05 MED ORDER — DEXAMETHASONE SODIUM PHOSPHATE 4 MG/ML IJ SOLN
INTRAMUSCULAR | Status: DC | PRN
  Administered 2021-09-05: 10 mg via INTRAVENOUS

## 2021-09-05 MED ORDER — OXYCODONE HCL 5 MG PO TABS: 5.0000 mg | ORAL_TABLET | Freq: Once | ORAL | Status: DC | PRN

## 2021-09-05 MED ORDER — MIDAZOLAM HCL 5 MG/5ML IJ SOLN
INTRAMUSCULAR | Status: DC | PRN
  Administered 2021-09-05: 2 mg via INTRAVENOUS

## 2021-09-05 MED ORDER — MIDAZOLAM HCL 2 MG/2ML IJ SOLN
INTRAMUSCULAR | Status: AC
  Filled 2021-09-05: qty 2

## 2021-09-05 MED ORDER — PROPOFOL 10 MG/ML IV BOLUS
INTRAVENOUS | Status: DC | PRN
  Administered 2021-09-05: 30 mg via INTRAVENOUS
  Administered 2021-09-05: 150 mg via INTRAVENOUS
  Administered 2021-09-05: 20 mg via INTRAVENOUS

## 2021-09-05 MED ORDER — BUPIVACAINE HCL (PF) 0.25 % IJ SOLN
INTRAMUSCULAR | Status: DC | PRN
  Administered 2021-09-05: 20 mL via INTRA_ARTICULAR

## 2021-09-05 MED ORDER — FENTANYL CITRATE (PF) 100 MCG/2ML IJ SOLN
INTRAMUSCULAR | Status: DC | PRN
  Administered 2021-09-05 (×2): 50 ug via INTRAVENOUS

## 2021-09-05 MED ORDER — SCOPOLAMINE 1 MG/3DAYS TD PT72
MEDICATED_PATCH | TRANSDERMAL | Status: AC
  Filled 2021-09-05: qty 1

## 2021-09-05 MED ORDER — PROPOFOL 10 MG/ML IV BOLUS
INTRAVENOUS | Status: AC
  Filled 2021-09-05: qty 20

## 2021-09-05 MED ORDER — ONDANSETRON HCL 4 MG/2ML IJ SOLN
INTRAMUSCULAR | Status: DC | PRN
  Administered 2021-09-05: 4 mg via INTRAVENOUS

## 2021-09-05 MED ORDER — OXYCODONE HCL 5 MG PO TABS
5.0000 mg | ORAL_TABLET | Freq: Four times a day (QID) | ORAL | 0 refills | Status: AC | PRN
  Filled 2021-09-05: qty 16, 5d supply, fill #0

## 2021-09-05 MED ORDER — LIDOCAINE 2% (20 MG/ML) 5 ML SYRINGE
INTRAMUSCULAR | Status: DC | PRN
  Administered 2021-09-05: 60 mg via INTRAVENOUS

## 2021-09-05 MED ORDER — ACETAMINOPHEN 500 MG PO TABS
1000.0000 mg | ORAL_TABLET | Freq: Once | ORAL | Status: AC
  Administered 2021-09-05: 1000 mg via ORAL

## 2021-09-05 MED ORDER — LACTATED RINGERS IV SOLN: INTRAVENOUS | Status: DC

## 2021-09-05 SURGICAL SUPPLY — 40 items
APL PRP STRL LF DISP 70% ISPRP (MISCELLANEOUS) ×2
BANDAGE ESMARK 6X9 LF (GAUZE/BANDAGES/DRESSINGS) IMPLANT
BLADE CLIPPER SURG (BLADE) IMPLANT
BNDG CMPR 9X6 STRL LF SNTH (GAUZE/BANDAGES/DRESSINGS)
BNDG ELASTIC 6X5.8 VLCR STR LF (GAUZE/BANDAGES/DRESSINGS) ×2 IMPLANT
BNDG ESMARK 6X9 LF (GAUZE/BANDAGES/DRESSINGS)
CHLORAPREP W/TINT 26 (MISCELLANEOUS) ×3 IMPLANT
CLSR STERI-STRIP ANTIMIC 1/2X4 (GAUZE/BANDAGES/DRESSINGS) ×2 IMPLANT
CUFF TOURN SGL QUICK 34 (TOURNIQUET CUFF)
CUFF TRNQT CYL 34X4.125X (TOURNIQUET CUFF) ×1 IMPLANT
DISSECTOR 3.5MM X 13CM CVD (MISCELLANEOUS) IMPLANT
DISSECTOR 4.0MMX13CM CVD (MISCELLANEOUS) ×2 IMPLANT
DRAPE ARTHROSCOPY W/POUCH 90 (DRAPES) ×2 IMPLANT
DRAPE IMP U-DRAPE 54X76 (DRAPES) ×2 IMPLANT
DRAPE U-SHAPE 47X51 STRL (DRAPES) ×2 IMPLANT
GAUZE SPONGE 4X4 12PLY STRL (GAUZE/BANDAGES/DRESSINGS) ×2 IMPLANT
GLOVE SRG 8 PF TXTR STRL LF DI (GLOVE) ×1 IMPLANT
GLOVE SURG ENC MOIS LTX SZ6.5 (GLOVE) ×2 IMPLANT
GLOVE SURG ENC MOIS LTX SZ7 (GLOVE) ×1 IMPLANT
GLOVE SURG LTX SZ8 (GLOVE) ×4 IMPLANT
GLOVE SURG UNDER POLY LF SZ6.5 (GLOVE) ×2 IMPLANT
GLOVE SURG UNDER POLY LF SZ7 (GLOVE) ×1 IMPLANT
GLOVE SURG UNDER POLY LF SZ8 (GLOVE) ×2
GOWN STRL REUS W/ TWL LRG LVL3 (GOWN DISPOSABLE) ×1 IMPLANT
GOWN STRL REUS W/TWL LRG LVL3 (GOWN DISPOSABLE) ×4
GOWN STRL REUS W/TWL XL LVL3 (GOWN DISPOSABLE) ×2 IMPLANT
KIT TURNOVER KIT B (KITS) ×2 IMPLANT
MANIFOLD NEPTUNE II (INSTRUMENTS) ×1 IMPLANT
NDL SAFETY ECLIPSE 18X1.5 (NEEDLE) ×1 IMPLANT
NEEDLE HYPO 18GX1.5 SHARP (NEEDLE) ×2
NS IRRIG 1000ML POUR BTL (IV SOLUTION) IMPLANT
PACK ARTHROSCOPY DSU (CUSTOM PROCEDURE TRAY) ×2 IMPLANT
PORT APPOLLO RF 90DEGREE MULTI (SURGICAL WAND) IMPLANT
SLEEVE SCD COMPRESS KNEE MED (STOCKING) ×1 IMPLANT
SUT MNCRL AB 4-0 PS2 18 (SUTURE) ×2 IMPLANT
SYR 5ML LUER SLIP (SYRINGE) ×1 IMPLANT
TOWEL GREEN STERILE FF (TOWEL DISPOSABLE) ×2 IMPLANT
TUBE CONNECTING 20X1/4 (TUBING) ×2 IMPLANT
TUBING ARTHROSCOPY IRRIG 16FT (MISCELLANEOUS) ×2 IMPLANT
WATER STERILE IRR 1000ML POUR (IV SOLUTION) ×1 IMPLANT

## 2021-09-05 NOTE — Anesthesia Procedure Notes (Signed)
Procedure Name: LMA Insertion Date/Time: 09/05/2021 12:06 PM Performed by: Lieutenant Diego, CRNA Pre-anesthesia Checklist: Patient identified, Emergency Drugs available, Suction available and Patient being monitored Patient Re-evaluated:Patient Re-evaluated prior to induction Oxygen Delivery Method: Circle system utilized Preoxygenation: Pre-oxygenation with 100% oxygen Induction Type: IV induction Ventilation: Mask ventilation without difficulty LMA: LMA inserted LMA Size: 4.0 Number of attempts: 1 Placement Confirmation: positive ETCO2 and breath sounds checked- equal and bilateral Tube secured with: Tape Dental Injury: Teeth and Oropharynx as per pre-operative assessment

## 2021-09-05 NOTE — Transfer of Care (Signed)
Immediate Anesthesia Transfer of Care Note  Patient: Debra Craig  Procedure(s) Performed: KNEE ARTHROSCOPY WITH EXCISION PLICA (Right: Knee)  Patient Location: PACU  Anesthesia Type:General  Level of Consciousness: awake and alert   Airway & Oxygen Therapy: Patient Spontanous Breathing and Patient connected to face mask oxygen  Post-op Assessment: Report given to RN and Post -op Vital signs reviewed and stable  Post vital signs: Reviewed and stable  Last Vitals:  Vitals Value Taken Time  BP 112/59 09/05/21 1242  Temp    Pulse 71 09/05/21 1243  Resp 16 09/05/21 1243  SpO2 100 % 09/05/21 1243  Vitals shown include unvalidated device data.  Last Pain:  Vitals:   09/05/21 1134  TempSrc: Oral  PainSc: 0-No pain      Patients Stated Pain Goal: 3 (21/19/41 7408)  Complications: No notable events documented.

## 2021-09-05 NOTE — Interval H&P Note (Signed)
All questions answered, patient wants to proceed with procedure. ? ?

## 2021-09-05 NOTE — Anesthesia Preprocedure Evaluation (Addendum)
Anesthesia Evaluation  Patient identified by MRN, date of birth, ID band Patient awake    Reviewed: Allergy & Precautions, NPO status , Patient's Chart, lab work & pertinent test results  Airway Mallampati: I  TM Distance: >3 FB Neck ROM: Full    Dental no notable dental hx.    Pulmonary neg pulmonary ROS,    Pulmonary exam normal breath sounds clear to auscultation       Cardiovascular Exercise Tolerance: Good negative cardio ROS Normal cardiovascular exam Rhythm:Regular Rate:Normal     Neuro/Psych PSYCHIATRIC DISORDERS Anxiety negative neurological ROS     GI/Hepatic negative GI ROS, Neg liver ROS,   Endo/Other  negative endocrine ROS  Renal/GU negative Renal ROS  negative genitourinary   Musculoskeletal negative musculoskeletal ROS (+)   Abdominal   Peds negative pediatric ROS (+)  Hematology negative hematology ROS (+)   Anesthesia Other Findings   Reproductive/Obstetrics negative OB ROS                            Anesthesia Physical Anesthesia Plan  ASA: 1  Anesthesia Plan: General   Post-op Pain Management:    Induction: Intravenous  PONV Risk Score and Plan: 3 and Scopolamine patch - Pre-op, Treatment may vary due to age or medical condition, Midazolam, TIVA, Propofol infusion, Dexamethasone and Ondansetron  Airway Management Planned: LMA  Additional Equipment: None  Intra-op Plan:   Post-operative Plan: Extubation in OR  Informed Consent: I have reviewed the patients History and Physical, chart, labs and discussed the procedure including the risks, benefits and alternatives for the proposed anesthesia with the patient or authorized representative who has indicated his/her understanding and acceptance.     Dental advisory given  Plan Discussed with: CRNA, Anesthesiologist and Surgeon  Anesthesia Plan Comments:         Anesthesia Quick Evaluation

## 2021-09-05 NOTE — Op Note (Signed)
Orthopaedic Surgery Operative Note (CSN: [id number])  Debra Craig  Mar 06, 1986 Date of Surgery: 09/05/2021   Diagnoses:  Right knee medial pain  Procedure: Right plica excision   Operative Finding Exam was essentially normal preoperatively with no ligamentous instability.  Full range of motion.  Patient's joint was pristine with the exception of a medial femoral condyle wear pattern and a corresponding plical band.  This was excised as well as the fat pad in the anterior interval.  We cleared the joint of any possible loose bodies.  Patient did have significant redness around the ACL as was the anterior and medial fat pad.  Successful completion of the planned procedure.    Post-operative plan: The patient will be weightbearing to tolerance.  The patient will be discharged home.  DVT prophylaxis Aspirin 81 mg twice daily for 6 weeks.  Pain control with PRN pain medication preferring oral medicines.  Follow up plan will be scheduled in approximately 7 days for incision check and XR.  Post-Op Diagnosis: Same Surgeons:Primary: Hiram Gash, MD Assistants:Caroline McBane PA-C Location: [place] OR ROOM 6 Anesthesia: General with local anesthetic Antibiotics: Ancef 2 g Tourniquet time: None Estimated Blood Loss: Minimal Complications: None Specimens: None Implants: * No implants in log *  Indications for Surgery:   [name] is a 35 y.o. female with continued medial knee pain and symptoms refractory to nonoperative measures there is concern for an occult meniscus tear.  Benefits and risks of operative and nonoperative management were discussed prior to surgery with patient/guardian(s) and informed consent form was completed.  Specific risks including infection, need for additional surgery, continued pain, DVT, stiffness amongst others.   Procedure:   The patient was identified properly. Informed consent was obtained and the surgical site was marked. The patient was taken up to suite  where general anesthesia was induced. The patient was placed in the supine position with a post against the surgical leg and a nonsterile tourniquet applied. The surgical leg was then prepped and draped usual sterile fashion.  A standard surgical timeout was performed.  2 standard anterior portals were made and diagnostic arthroscopy performed. Please note the findings as noted above.  We cleared the joint as above.  We excised the bed from medial lateral portals.  The medial plica was excised.  We cleared the remainder the joint.  Incisions closed with absorbable suture. The patient was awoken from general anesthesia and taken to the PACU in stable condition without complication.   Noemi Chapel, PA-C, present and scrubbed throughout the case, critical for completion in a timely fashion, and for retraction, instrumentation, closure.

## 2021-09-05 NOTE — Discharge Instructions (Signed)
Ophelia Charter MD, MPH Noemi Chapel, PA-C [facility] [address] [phone] (tel)   [phone] (fax)   POST-OPERATIVE INSTRUCTIONS - Knee Arthroscopy  WOUND CARE - You may remove the Operative Dressing on Post-Op Day #3 (72hrs after surgery).   -  Alternatively if you would like you can leave dressing on until follow-up if within 7-8 days but keep it dry. - Leave steri-strips in place until they fall off on their own, usually 2 weeks postop. - An ACE wrap may be used to control swelling, do not wrap this too tight.  If the initial ACE wrap feels too tight you may loosen it. - There may be a small amount of fluid/bleeding leaking at the surgical site.  - This is normal; the knee is filled with fluid during the procedure and can leak for 24-48hrs after surgery. You may change/reinforce the bandage as needed.  - Use the Cryocuff or Ice as often as possible for the first 7 days, then as needed for pain relief. Always keep a towel, ACE wrap or other barrier between the cooling unit and your skin.  - You may shower on Post-Op Day #3. Gently pat the area dry. Do not soak the knee in water or submerge it.  - Do not go swimming in the pool or ocean until 4 weeks after surgery or when otherwise instructed.  Keep incisions as dry as possible.   BRACE/AMBULATION -  You will not need a brace after this procedure.   - You may use crutches initially to help you ambulate, but this is not required - You can put full weight on your operative leg as you feel comfortable  REGIONAL ANESTHESIA (NERVE BLOCKS) - The anesthesia team may have performed a nerve block for you if safe in the setting of your care.  This is a great tool used to minimize pain.  Typically the block may start wearing off overnight.  This can be a challenging period but please utilize your as needed pain medications to try and manage this period and know it will be a brief transition as the nerve  block wears completely   POST-OP MEDICATIONS - Multimodal approach to pain control - In general your pain will be controlled with a combination of substances.  Prescriptions unless otherwise discussed are electronically sent to your pharmacy.  This is a carefully made plan we use to minimize narcotic use.     - Meloxicam - Anti-inflammatory medication taken on a scheduled basis - Acetaminophen - Non-narcotic pain medicine taken on a scheduled basis  - Oxycodone - This is a strong narcotic, to be used only on an "as needed" basis for SEVERE pain. - Aspirin 81mg  - This medicine is used to minimize the risk of blood clots after surgery. -  Zofran - take as needed for nausea   FOLLOW-UP   Please call the office to schedule a follow-up appointment for your incision check, 7-10 days post-operatively.   IF YOU HAVE ANY QUESTIONS, PLEASE FEEL FREE TO CALL OUR OFFICE.   HELPFUL INFORMATION  - If you had a block, it will wear off between 8-24 hrs postop typically.  This is period when your pain may go from nearly zero to the pain you would have had post-op without the block.  This is an abrupt transition but nothing dangerous is happening.  You may take an extra dose of narcotic when this happens.   Keep your leg elevated to decrease swelling,  which will then in turn decrease your pain. I would elevate the foot of your bed by putting a couple of couch pillows between your mattress and box spring. I would not keep pillow directly under your ankle.  - Do not sleep with a pillow behind your knee even if it is more comfortable as this may make it harder to get your knee fully straight long term.   There will be MORE swelling on days 1-3 than there is on the day of surgery.  This also is normal. The swelling will decrease with the anti-inflammatory medication, ice and keeping it elevated. The swelling will make it more difficult to bend your knee. As the swelling goes down your motion will become  easier   You may develop swelling and bruising that extends from your knee down to your calf and perhaps even to your foot over the next week. Do not be alarmed. This too is normal, and it is due to gravity   There may be some numbness adjacent to the incision site. This may last for 6-12 months or longer in some patients and is expected.   You may return to sedentary work/school in the next couple of days when you feel up to it. You will need to keep your leg elevated as much as possible    You should wean off your narcotic medicines as soon as you are able.  Most patients will be off or using minimal narcotics before their first postop appointment.    We suggest you use the pain medication the first night prior to going to bed, in order to ease any pain when the anesthesia wears off. You should avoid taking pain medications on an empty stomach as it will make you nauseous.   Do not drink alcoholic beverages or take illicit drugs when taking pain medications.   It is against the law to drive while taking narcotics. You cannot drive if your Right leg is in brace locked in extension.   Pain medication may make you constipated.  Below are a few solutions to try in this order:  o Decrease the amount of pain medication if you aren't having pain.  o Drink lots of decaffeinated fluids.  o Drink prune juice and/or eat dried prunes   o If the first 3 don't work start with additional solutions  o Take Colace - an over-the-counter stool softener  o Take Senokot - an over-the-counter laxative  o Take Miralax - a stronger over-the-counter laxative    For more information including helpful videos and documents visit our website:   [email or web address]

## 2021-09-06 ENCOUNTER — Encounter (HOSPITAL_BASED_OUTPATIENT_CLINIC_OR_DEPARTMENT_OTHER): Payer: Self-pay | Admitting: Orthopaedic Surgery

## 2021-09-06 NOTE — Anesthesia Postprocedure Evaluation (Signed)
Anesthesia Post Note  Patient: Debra Craig  Procedure(s) Performed: KNEE ARTHROSCOPY WITH EXCISION PLICA (Right: Knee)     Patient location during evaluation: PACU Anesthesia Type: General Level of consciousness: awake and alert Pain management: pain level controlled Vital Signs Assessment: post-procedure vital signs reviewed and stable Respiratory status: spontaneous breathing, nonlabored ventilation and respiratory function stable Cardiovascular status: blood pressure returned to baseline and stable Postop Assessment: no apparent nausea or vomiting Anesthetic complications: no   No notable events documented.  Last Vitals:  Vitals:   09/05/21 1330 09/05/21 1345  BP: 114/65 121/74  Pulse: 63 75  Resp: 12 16  Temp:  36.8 C  SpO2: 100% 100%    Last Pain:  Vitals:   09/05/21 1345  TempSrc:   PainSc: 3                  Candra R Troye Hiemstra

## 2021-09-09 NOTE — Addendum Note (Signed)
Addendum  created 09/09/21 5848 by Ezequiel Kayser, CRNA   Charge Capture section accepted

## 2021-09-10 ENCOUNTER — Encounter: Payer: 59 | Admitting: Neurology

## 2021-09-13 ENCOUNTER — Other Ambulatory Visit (HOSPITAL_COMMUNITY): Payer: Self-pay

## 2021-09-14 DIAGNOSIS — M6281 Muscle weakness (generalized): Secondary | ICD-10-CM | POA: Diagnosis not present

## 2021-09-14 DIAGNOSIS — R262 Difficulty in walking, not elsewhere classified: Secondary | ICD-10-CM | POA: Diagnosis not present

## 2021-09-14 DIAGNOSIS — M25661 Stiffness of right knee, not elsewhere classified: Secondary | ICD-10-CM | POA: Diagnosis not present

## 2021-09-16 ENCOUNTER — Other Ambulatory Visit (HOSPITAL_COMMUNITY): Payer: Self-pay

## 2021-09-18 DIAGNOSIS — M6281 Muscle weakness (generalized): Secondary | ICD-10-CM | POA: Diagnosis not present

## 2021-09-18 DIAGNOSIS — R262 Difficulty in walking, not elsewhere classified: Secondary | ICD-10-CM | POA: Diagnosis not present

## 2021-09-18 DIAGNOSIS — M25661 Stiffness of right knee, not elsewhere classified: Secondary | ICD-10-CM | POA: Diagnosis not present

## 2021-09-21 ENCOUNTER — Other Ambulatory Visit: Payer: Self-pay

## 2021-09-21 ENCOUNTER — Emergency Department (HOSPITAL_BASED_OUTPATIENT_CLINIC_OR_DEPARTMENT_OTHER)
Admission: EM | Admit: 2021-09-21 | Discharge: 2021-09-21 | Disposition: A | Discharge status: Home / Self Care | Payer: 59 | Attending: Emergency Medicine | Admitting: Emergency Medicine

## 2021-09-21 ENCOUNTER — Encounter (HOSPITAL_BASED_OUTPATIENT_CLINIC_OR_DEPARTMENT_OTHER): Payer: Self-pay | Admitting: Emergency Medicine

## 2021-09-21 ENCOUNTER — Emergency Department (HOSPITAL_BASED_OUTPATIENT_CLINIC_OR_DEPARTMENT_OTHER): Payer: 59

## 2021-09-21 DIAGNOSIS — M79604 Pain in right leg: Secondary | ICD-10-CM | POA: Insufficient documentation

## 2021-09-21 DIAGNOSIS — M79662 Pain in left lower leg: Secondary | ICD-10-CM | POA: Diagnosis not present

## 2021-09-21 DIAGNOSIS — M79661 Pain in right lower leg: Secondary | ICD-10-CM | POA: Diagnosis not present

## 2021-09-21 NOTE — ED Provider Notes (Signed)
[facility] EMERGENCY DEPT Provider Note   CSN: [id number] Arrival date & time: 09/21/21  1759     History Chief Complaint  Patient presents with   Leg Pain    Debra Craig is a 34 y.o. female who presents to the emergency department with a 3-day history of intermittent right-sided calf pain.  She recently had a knee arthroscopy done 2 weeks ago and she also takes estrogen.  She denies any swelling or erythema to the leg.  She also denies any chest pain, shortness of breath, loss of consciousness, fever, and chills.  She has been taking aspirin.  The history is provided by the patient. No language interpreter was used.  Leg Pain     Past Medical History:  Diagnosis Date   Acne    Anxiety    Elevated LDL cholesterol level    Liver hemangioma    Muscle twitching    Vitamin D deficiency     Patient Active Problem List   Diagnosis Date Noted   Anxiety disorder 01/13/2020   Indication for care in labor or delivery 11/08/2019   NSVD (normal spontaneous vaginal delivery) 11/08/2019   Acne vulgaris 05/29/2015    Past Surgical History:  Procedure Laterality Date   IUI     KNEE ARTHROSCOPY WITH EXCISION PLICA Right 10/09/1477   Procedure: KNEE ARTHROSCOPY WITH EXCISION PLICA;  Surgeon: Hiram Gash, MD;  Location: [facility];  Service: Orthopedics;  Laterality: Right;   vaginal delievery     WISDOM TOOTH EXTRACTION       OB History     Gravida  1   Para  1   Term  1   Preterm      AB      Living  1      SAB      IAB      Ectopic      Multiple  0   Live Births  1           No family history on file.  Social History   Tobacco Use   Smoking status: Never   Smokeless tobacco: Never  Vaping Use   Vaping Use: Never used  Substance Use Topics   Alcohol use: Yes    Comment: Social Drinking   Drug use: No    Home Medications Prior to Admission medications   Medication Sig Start Date End Date Taking?  Authorizing Provider  aspirin (ASPIRIN CHILDRENS) 81 MG chewable tablet Chew 1 tablet (81 mg total) by mouth 2 (two) times daily. For DVT prophylaxis after surgery 09/05/21 10/17/21  Ethelda Chick, PA-C  doxycycline (VIBRAMYCIN) 100 MG capsule Take 1 capsule (100 mg total) by mouth 2 (two) times daily. 08/23/21     estradiol (ESTRACE) 2 MG tablet Take 1 tablet (2 mg total) by mouth 2 (two) times daily. 08/22/21     estradiol (VIVELLE-DOT) 0.1 MG/24HR patch Apply 1 patch every 3 days 08/22/21     meloxicam (MOBIC) 7.5 MG tablet Take 1 tablet by mouth twice a day 08/26/21     methylPREDNISolone (MEDROL) 8 MG tablet Take 1 tablet (8 mg total) by mouth 2 (two) times daily. 08/22/21     NEEDLE, DISP, 22 G (B-D HYPODERMIC NEEDLE 22GX1.5") 22G X 1-1/2" MISC use to inject Progesterone 08/22/21     Prenatal Vit-Fe Fumarate-FA (PRENATAL MULTIVITAMIN) TABS tablet Take 1 tablet by mouth daily at 12 noon.    [provider]  progesterone 50 MG/ML  injection Inject 1 mL (50 mg total) into the muscle daily. 08/22/21     sertraline (ZOLOFT) 50 MG tablet 50 mg. Take 3 tablets daily 12/22/19   [provider]  sertraline (ZOLOFT) 50 MG tablet TAKE 3 TABLETS BY MOUTH DAILY 08/09/20 08/20/21  Paula Compton, MD  sertraline (ZOLOFT) 50 MG tablet Take 3 tablets (150 mg total) by mouth daily. 09/05/21     SYRINGE-NEEDLE, DISP, 3 ML (B-D 3CC LUER-LOK SYR 18GX1-1/2) 18G X 1-1/2" 3 ML MISC Use with Progesterone injections 08/22/21       Allergies    Penicillins  Review of Systems   Review of Systems  All other systems reviewed and are negative.  Physical Exam Updated Vital Signs BP 123/66   Pulse 66   Temp 98.2 F (36.8 C) (Oral)   Resp 16   Ht 5\' 5"  (1.651 m)   Wt 59.7 kg   LMP 09/10/2021 (Exact Date)   SpO2 100%   Breastfeeding No   BMI 21.90 kg/m   Physical Exam Vitals and nursing note reviewed.  Constitutional:      General: She is not in acute distress.    Appearance: Normal  appearance.  HENT:     Head: Normocephalic and atraumatic.  Eyes:     General:        Right eye: No discharge.        Left eye: No discharge.  Cardiovascular:     Comments: Regular rate and rhythm.  S1/S2 are distinct without any evidence of murmur, rubs, or gallops.  Radial pulses are 2+ bilaterally.  Dorsalis pedis pulses are 2+ bilaterally.  No evidence of pedal edema. Pulmonary:     Comments: Clear to auscultation bilaterally.  Normal effort.  No respiratory distress.  No evidence of wheezes, rales, or rhonchi heard throughout. Abdominal:     General: Abdomen is flat. Bowel sounds are normal. There is no distension.     Tenderness: There is no abdominal tenderness. There is no guarding or rebound.  Musculoskeletal:        General: Normal range of motion.     Cervical back: Neck supple.     Comments: Negative Homans' sign.  Right lower extremity does not appear edematous or erythematous.  2+ dorsalis pedis pulse on the right.  Skin:    General: Skin is warm and dry.     Findings: No rash.  Neurological:     General: No focal deficit present.     Mental Status: She is alert.  Psychiatric:        Mood and Affect: Mood normal.        Behavior: Behavior normal.    ED Results / Procedures / Treatments   Labs (all labs ordered are listed, but only abnormal results are displayed) Labs Reviewed - No data to display  EKG None  Radiology US Venous Img Lower Right (DVT Study)  Result Date: 09/21/2021 CLINICAL DATA:  Right posterior calf pain x3 days. EXAM: RIGHT LOWER EXTREMITY VENOUS DOPPLER ULTRASOUND TECHNIQUE: Gray-scale sonography with compression, as well as color and duplex ultrasound, were performed to evaluate the deep venous system(s) from the level of the common femoral vein through the popliteal and proximal calf veins. COMPARISON:  None. FINDINGS: VENOUS Normal compressibility of the common femoral, superficial femoral, and popliteal veins, as well as the visualized calf  veins. Visualized portions of profunda femoral vein and great saphenous vein unremarkable. No filling defects to suggest DVT on grayscale or color Doppler imaging. Doppler  waveforms show normal direction of venous flow, normal respiratory plasticity and response to augmentation. Limited views of the contralateral common femoral vein are unremarkable. OTHER None. Limitations: none IMPRESSION: No evidence of DVT within the RIGHT lower extremity. Electronically Signed   By: Virgina Norfolk M.D.   On: 09/21/2021 22:02    Procedures Procedures   Medications Ordered in ED Medications - No data to display  ED Course  I have reviewed the triage vital signs and the nursing notes.  Pertinent labs & imaging results that were available during my care of the patient were reviewed by me and considered in my medical decision making (see chart for details).    MDM Rules/Calculators/A&P                          Debra Craig is a 35 y.o. female who presents to the emergency department for evaluation of right-sided leg pain.  History and physical exam is somewhat concerning for possible deep vein thrombosis.  I have a low suspicion for infectious causes of her leg pain at this time.  Though low suspicion for pulmonary embolism at this time.  Normal vital signs and she is not complaining of any shortness of breath.  Ultrasound did not reveal any DVT.  She is safe for discharge.  Strict return precautions given.   Final Clinical Impression(s) / ED Diagnoses Final diagnoses:  Right leg pain    Rx / DC Orders ED Discharge Orders     None        Cherrie Gauze 09/21/21 2215    Truddie Hidden, MD 09/21/21 [phone]

## 2021-09-21 NOTE — ED Triage Notes (Signed)
Pt had knee scope 2 weeks ago and started having posterior right calf pain since Thursday and is concerned about possible blood clot.

## 2021-09-23 DIAGNOSIS — E2839 Other primary ovarian failure: Secondary | ICD-10-CM | POA: Diagnosis not present

## 2021-09-23 DIAGNOSIS — Z3183 Encounter for assisted reproductive fertility procedure cycle: Secondary | ICD-10-CM | POA: Diagnosis not present

## 2021-09-23 DIAGNOSIS — N926 Irregular menstruation, unspecified: Secondary | ICD-10-CM | POA: Diagnosis not present

## 2021-09-24 DIAGNOSIS — E2839 Other primary ovarian failure: Secondary | ICD-10-CM | POA: Diagnosis not present

## 2021-09-24 DIAGNOSIS — N926 Irregular menstruation, unspecified: Secondary | ICD-10-CM | POA: Diagnosis not present

## 2021-09-24 DIAGNOSIS — Z3183 Encounter for assisted reproductive fertility procedure cycle: Secondary | ICD-10-CM | POA: Diagnosis not present

## 2021-09-25 ENCOUNTER — Other Ambulatory Visit (HOSPITAL_COMMUNITY): Payer: Self-pay

## 2021-09-25 MED ORDER — LEUPROLIDE ACETATE 1 MG/0.2ML IJ KIT
PACK | Freq: Every evening | INTRAMUSCULAR | 1 refills | Status: DC
  Filled 2021-09-25 – 2021-09-26 (×2): qty 1, 14d supply, fill #0
  Filled 2021-10-11: qty 1, 14d supply, fill #1

## 2021-09-25 MED ORDER — NORETHINDRONE ACETATE 5 MG PO TABS
5.0000 mg | ORAL_TABLET | Freq: Every day | ORAL | 1 refills | Status: DC
  Filled 2021-09-25: qty 15, 15d supply, fill #0

## 2021-09-26 ENCOUNTER — Other Ambulatory Visit (HOSPITAL_COMMUNITY): Payer: Self-pay

## 2021-09-27 ENCOUNTER — Other Ambulatory Visit (HOSPITAL_COMMUNITY): Payer: Self-pay

## 2021-10-02 DIAGNOSIS — M25661 Stiffness of right knee, not elsewhere classified: Secondary | ICD-10-CM | POA: Diagnosis not present

## 2021-10-02 DIAGNOSIS — M6281 Muscle weakness (generalized): Secondary | ICD-10-CM | POA: Diagnosis not present

## 2021-10-02 DIAGNOSIS — M6751 Plica syndrome, right knee: Secondary | ICD-10-CM | POA: Diagnosis not present

## 2021-10-02 DIAGNOSIS — R262 Difficulty in walking, not elsewhere classified: Secondary | ICD-10-CM | POA: Diagnosis not present

## 2021-10-03 ENCOUNTER — Encounter: Payer: 59 | Admitting: Neurology

## 2021-10-08 DIAGNOSIS — Z3183 Encounter for assisted reproductive fertility procedure cycle: Secondary | ICD-10-CM | POA: Diagnosis not present

## 2021-10-08 DIAGNOSIS — E2839 Other primary ovarian failure: Secondary | ICD-10-CM | POA: Diagnosis not present

## 2021-10-08 DIAGNOSIS — N926 Irregular menstruation, unspecified: Secondary | ICD-10-CM | POA: Diagnosis not present

## 2021-10-14 ENCOUNTER — Other Ambulatory Visit (HOSPITAL_COMMUNITY): Payer: Self-pay

## 2021-10-14 DIAGNOSIS — Z3183 Encounter for assisted reproductive fertility procedure cycle: Secondary | ICD-10-CM | POA: Diagnosis not present

## 2021-10-14 DIAGNOSIS — E2839 Other primary ovarian failure: Secondary | ICD-10-CM | POA: Diagnosis not present

## 2021-10-14 DIAGNOSIS — N926 Irregular menstruation, unspecified: Secondary | ICD-10-CM | POA: Diagnosis not present

## 2021-10-15 ENCOUNTER — Encounter: Payer: 59 | Admitting: Neurology

## 2021-10-16 DIAGNOSIS — R262 Difficulty in walking, not elsewhere classified: Secondary | ICD-10-CM | POA: Diagnosis not present

## 2021-10-16 DIAGNOSIS — M25661 Stiffness of right knee, not elsewhere classified: Secondary | ICD-10-CM | POA: Diagnosis not present

## 2021-10-16 DIAGNOSIS — M6751 Plica syndrome, right knee: Secondary | ICD-10-CM | POA: Diagnosis not present

## 2021-10-16 DIAGNOSIS — M6281 Muscle weakness (generalized): Secondary | ICD-10-CM | POA: Diagnosis not present

## 2021-10-17 ENCOUNTER — Other Ambulatory Visit (HOSPITAL_COMMUNITY): Payer: Self-pay

## 2021-10-22 ENCOUNTER — Other Ambulatory Visit (HOSPITAL_COMMUNITY): Payer: Self-pay

## 2021-10-23 DIAGNOSIS — E2839 Other primary ovarian failure: Secondary | ICD-10-CM | POA: Diagnosis not present

## 2021-10-23 DIAGNOSIS — Z3183 Encounter for assisted reproductive fertility procedure cycle: Secondary | ICD-10-CM | POA: Diagnosis not present

## 2021-10-23 DIAGNOSIS — N926 Irregular menstruation, unspecified: Secondary | ICD-10-CM | POA: Diagnosis not present

## 2021-11-05 DIAGNOSIS — M6751 Plica syndrome, right knee: Secondary | ICD-10-CM | POA: Diagnosis not present

## 2021-11-08 DIAGNOSIS — M6751 Plica syndrome, right knee: Secondary | ICD-10-CM | POA: Diagnosis not present

## 2021-11-08 DIAGNOSIS — M25661 Stiffness of right knee, not elsewhere classified: Secondary | ICD-10-CM | POA: Diagnosis not present

## 2021-11-08 DIAGNOSIS — R262 Difficulty in walking, not elsewhere classified: Secondary | ICD-10-CM | POA: Diagnosis not present

## 2021-11-08 DIAGNOSIS — M6281 Muscle weakness (generalized): Secondary | ICD-10-CM | POA: Diagnosis not present

## 2021-11-22 ENCOUNTER — Other Ambulatory Visit (HOSPITAL_COMMUNITY): Payer: Self-pay

## 2021-11-28 IMAGING — US US EXTREM LOW VENOUS*R*
1 series · 14 of 24 positions shown · non-contrast
Comparison: None.

CLINICAL DATA: Right posterior calf pain x3 days.

EXAM:
RIGHT LOWER EXTREMITY VENOUS DOPPLER ULTRASOUND
TECHNIQUE: Gray-scale sonography with compression, as well as color and duplex
ultrasound, were performed to evaluate the deep venous system(s)
from the level of the common femoral vein through the popliteal and
proximal calf veins.

[Series 1: us venous img lower uni right (dvt) · portal-venous · 14 of 55 slices shown]
[im 1/55]
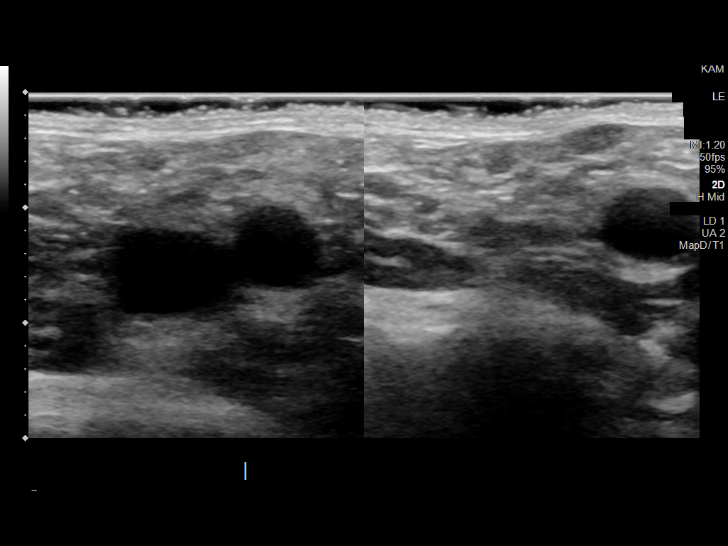
[im 5/55]
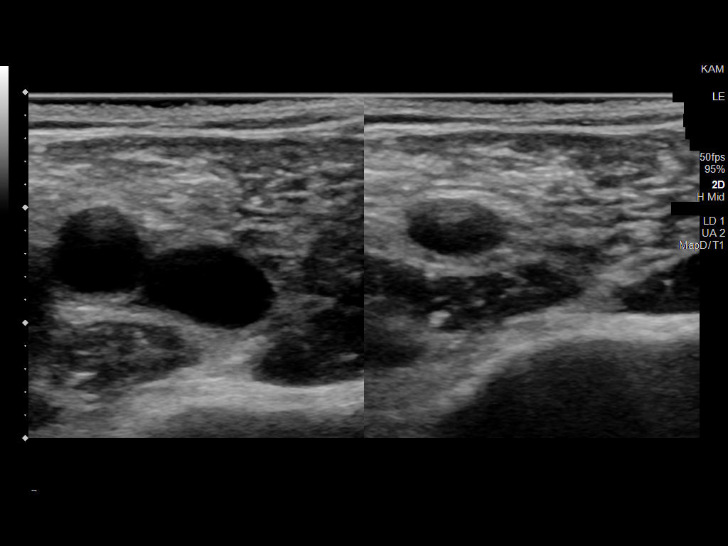
[im 10/55]
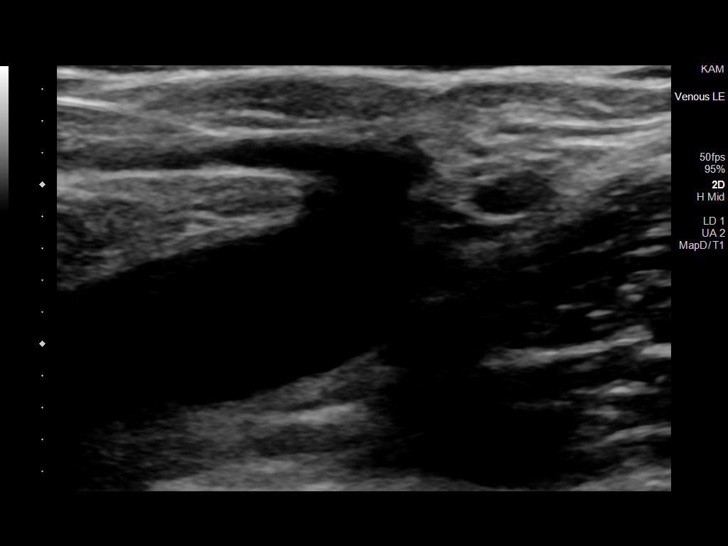
[im 15/55]
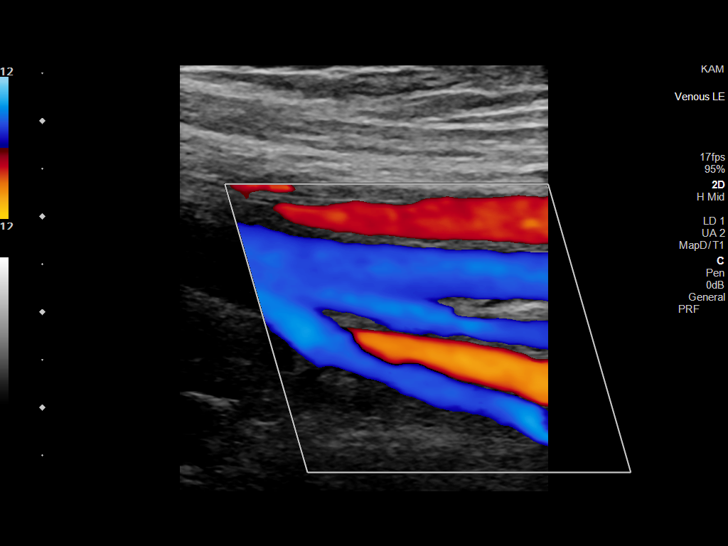
[im 17/55]
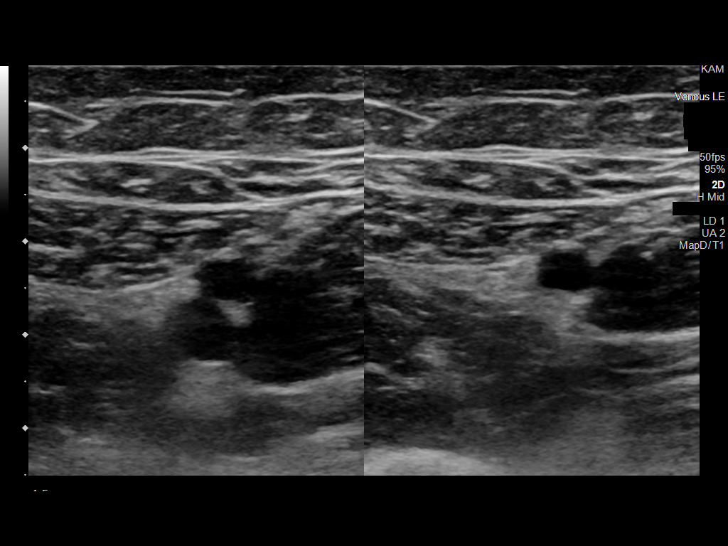
[im 22/55]
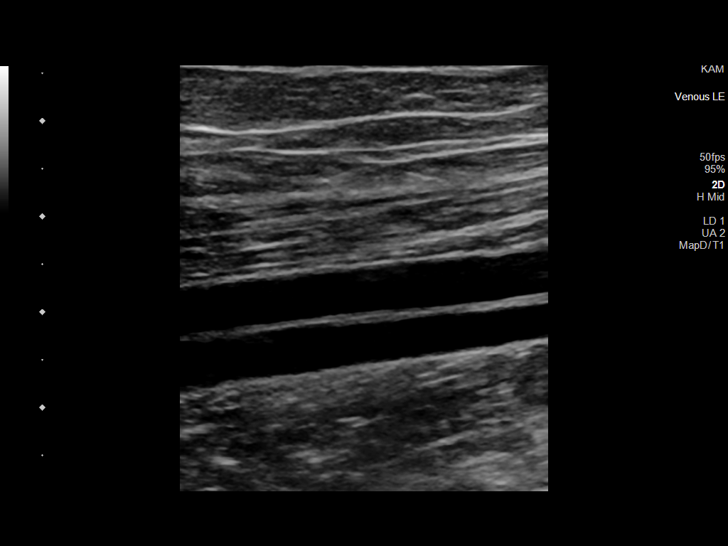
[im 26/55]
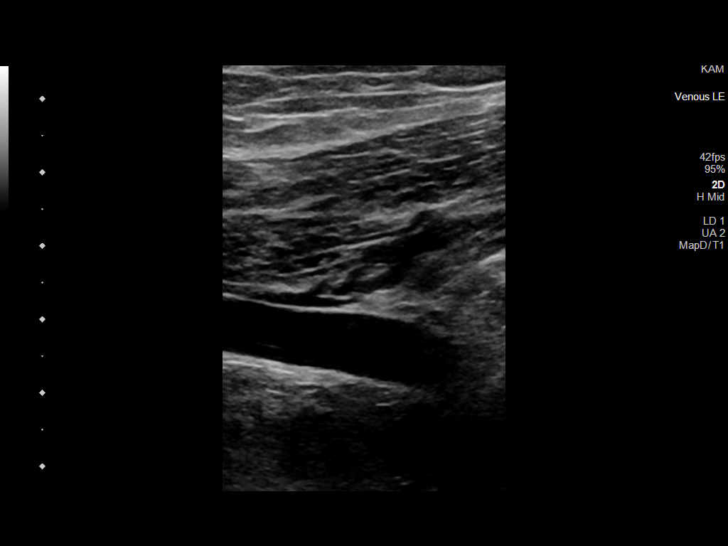
[im 29/55]
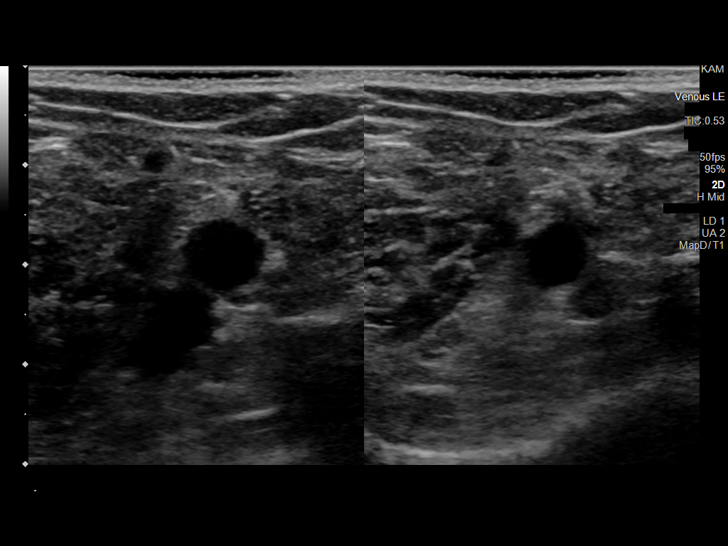
[im 33/55]
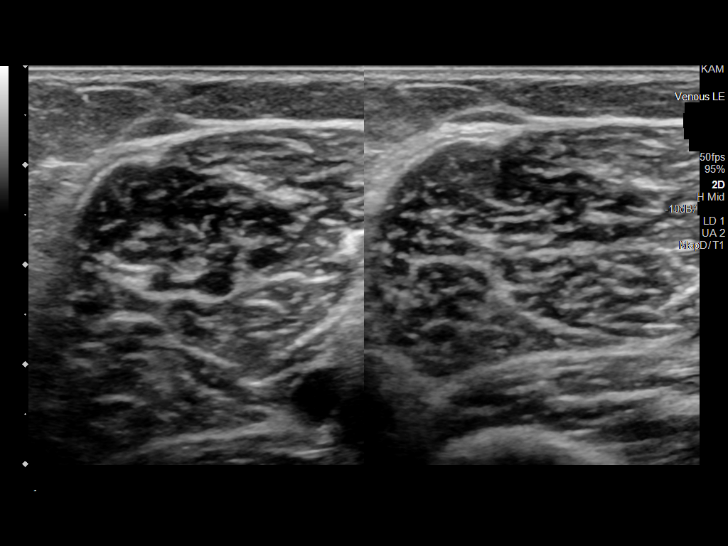
[im 38/55]
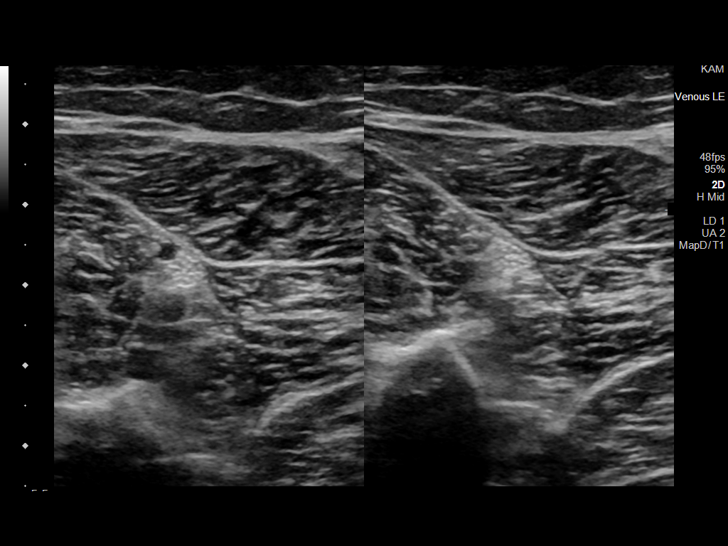
[im 43/55]
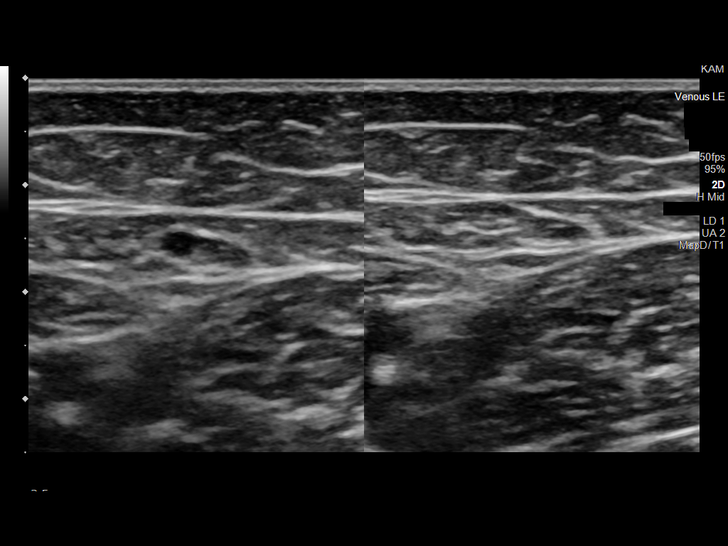
[im 45/55]
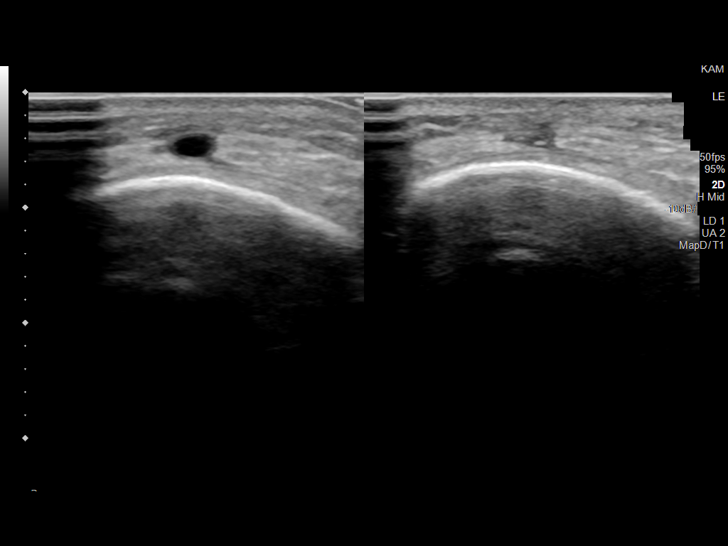
[im 50/55]
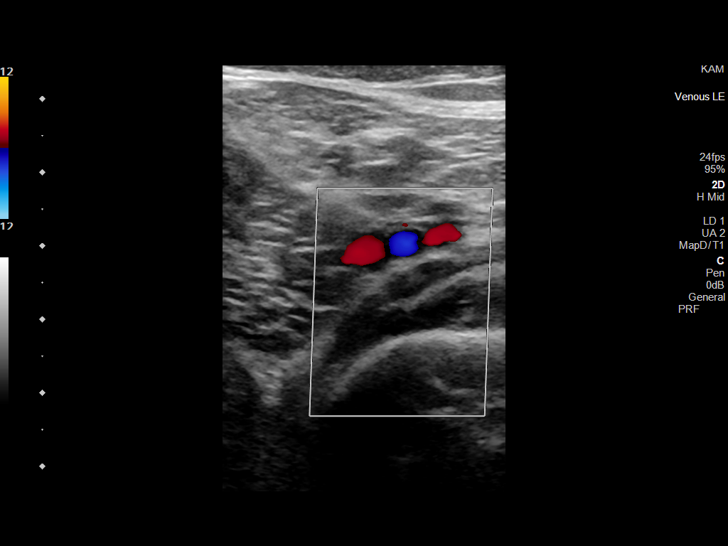
[im 55/55]
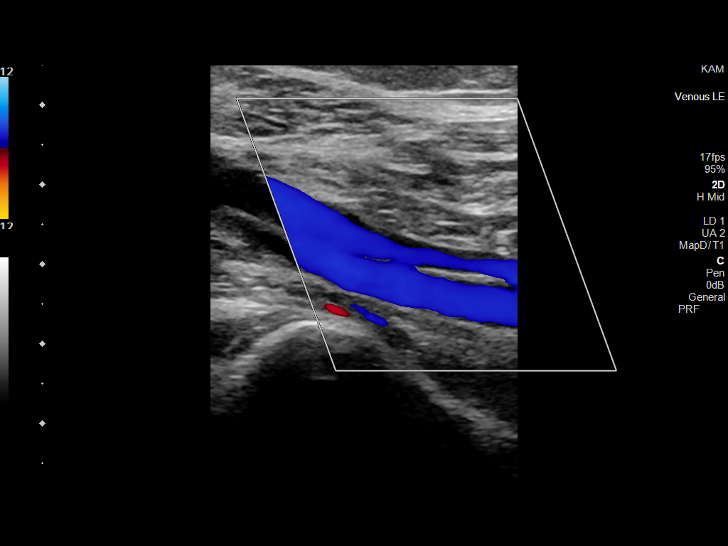

[14 of 24 positions shown; findings below may reference images not displayed]

FINDINGS: VENOUS

Normal compressibility of the common femoral, superficial femoral,
and popliteal veins, as well as the visualized calf veins.
Visualized portions of profunda femoral vein and great saphenous
vein unremarkable. No filling defects to suggest DVT on grayscale or
color Doppler imaging. Doppler waveforms show normal direction of
venous flow, normal respiratory plasticity and response to
augmentation.

Limited views of the contralateral common femoral vein are
unremarkable.

OTHER

None.

Limitations: none
IMPRESSION: No evidence of DVT within the RIGHT lower extremity.

## 2021-12-05 ENCOUNTER — Other Ambulatory Visit (HOSPITAL_COMMUNITY): Payer: Self-pay

## 2021-12-05 MED ORDER — BUPROPION HCL ER (XL) 150 MG PO TB24
150.0000 mg | ORAL_TABLET | Freq: Every day | ORAL | 3 refills | Status: DC
  Filled 2021-12-05: qty 30, 30d supply, fill #0
  Filled 2022-01-07: qty 30, 30d supply, fill #1
  Filled 2022-02-06: qty 30, 30d supply, fill #2
  Filled 2022-03-10: qty 30, 30d supply, fill #3

## 2021-12-24 ENCOUNTER — Other Ambulatory Visit (HOSPITAL_COMMUNITY): Payer: Self-pay

## 2022-01-07 ENCOUNTER — Encounter: Payer: 59 | Admitting: Neurology

## 2022-01-07 ENCOUNTER — Other Ambulatory Visit (HOSPITAL_COMMUNITY): Payer: Self-pay

## 2022-01-09 DIAGNOSIS — N912 Amenorrhea, unspecified: Secondary | ICD-10-CM | POA: Diagnosis not present

## 2022-01-09 DIAGNOSIS — E2839 Other primary ovarian failure: Secondary | ICD-10-CM | POA: Diagnosis not present

## 2022-01-09 DIAGNOSIS — Z3183 Encounter for assisted reproductive fertility procedure cycle: Secondary | ICD-10-CM | POA: Diagnosis not present

## 2022-01-09 DIAGNOSIS — Z3200 Encounter for pregnancy test, result unknown: Secondary | ICD-10-CM | POA: Diagnosis not present

## 2022-01-09 DIAGNOSIS — N926 Irregular menstruation, unspecified: Secondary | ICD-10-CM | POA: Diagnosis not present

## 2022-01-09 DIAGNOSIS — Z3201 Encounter for pregnancy test, result positive: Secondary | ICD-10-CM | POA: Diagnosis not present

## 2022-01-10 ENCOUNTER — Other Ambulatory Visit (HOSPITAL_COMMUNITY): Payer: Self-pay

## 2022-01-10 MED ORDER — ESTRADIOL 0.1 MG/24HR TD PTTW
1.0000 | MEDICATED_PATCH | TRANSDERMAL | 4 refills | Status: DC
  Filled 2022-01-10: qty 8, 28d supply, fill #0
  Filled 2022-04-07: qty 8, 28d supply, fill #1
  Filled 2022-05-07: qty 8, 28d supply, fill #2
  Filled 2022-05-29: qty 8, 28d supply, fill #3

## 2022-01-10 MED ORDER — MEDROXYPROGESTERONE ACETATE 10 MG PO TABS
10.0000 mg | ORAL_TABLET | Freq: Every day | ORAL | 0 refills | Status: DC
  Filled 2022-01-10: qty 5, 5d supply, fill #0

## 2022-01-10 MED ORDER — ESTRADIOL 2 MG PO TABS
2.0000 mg | ORAL_TABLET | Freq: Two times a day (BID) | ORAL | 3 refills | Status: DC
  Filled 2022-01-10: qty 60, 30d supply, fill #0
  Filled 2022-11-15: qty 60, 30d supply, fill #1

## 2022-01-14 DIAGNOSIS — R42 Dizziness and giddiness: Secondary | ICD-10-CM | POA: Diagnosis not present

## 2022-01-24 ENCOUNTER — Ambulatory Visit: Payer: 59 | Admitting: Physician Assistant

## 2022-01-29 ENCOUNTER — Other Ambulatory Visit (HOSPITAL_COMMUNITY): Payer: Self-pay

## 2022-01-29 MED ORDER — SERTRALINE HCL 50 MG PO TABS
150.0000 mg | ORAL_TABLET | Freq: Every day | ORAL | 0 refills | Status: DC
  Filled 2022-01-29: qty 90, 30d supply, fill #0

## 2022-01-30 ENCOUNTER — Other Ambulatory Visit (HOSPITAL_COMMUNITY): Payer: Self-pay

## 2022-01-30 ENCOUNTER — Other Ambulatory Visit: Payer: Self-pay | Admitting: General Surgery

## 2022-01-30 DIAGNOSIS — D1721 Benign lipomatous neoplasm of skin and subcutaneous tissue of right arm: Secondary | ICD-10-CM | POA: Diagnosis not present

## 2022-01-30 DIAGNOSIS — N8500 Endometrial hyperplasia, unspecified: Secondary | ICD-10-CM | POA: Diagnosis not present

## 2022-01-30 DIAGNOSIS — N979 Female infertility, unspecified: Secondary | ICD-10-CM | POA: Diagnosis not present

## 2022-01-30 DIAGNOSIS — N912 Amenorrhea, unspecified: Secondary | ICD-10-CM | POA: Diagnosis not present

## 2022-01-30 MED ORDER — DOXYCYCLINE HYCLATE 100 MG PO CAPS
100.0000 mg | ORAL_CAPSULE | Freq: Two times a day (BID) | ORAL | 0 refills | Status: DC
  Filled 2022-01-30: qty 10, 5d supply, fill #0

## 2022-02-06 ENCOUNTER — Other Ambulatory Visit (HOSPITAL_COMMUNITY): Payer: Self-pay

## 2022-02-27 DIAGNOSIS — N926 Irregular menstruation, unspecified: Secondary | ICD-10-CM | POA: Diagnosis not present

## 2022-02-27 DIAGNOSIS — E2839 Other primary ovarian failure: Secondary | ICD-10-CM | POA: Diagnosis not present

## 2022-02-27 DIAGNOSIS — Z3183 Encounter for assisted reproductive fertility procedure cycle: Secondary | ICD-10-CM | POA: Diagnosis not present

## 2022-02-27 DIAGNOSIS — Z3201 Encounter for pregnancy test, result positive: Secondary | ICD-10-CM | POA: Diagnosis not present

## 2022-02-27 DIAGNOSIS — Z3200 Encounter for pregnancy test, result unknown: Secondary | ICD-10-CM | POA: Diagnosis not present

## 2022-02-28 ENCOUNTER — Ambulatory Visit: Payer: 59 | Admitting: Physician Assistant

## 2022-02-28 ENCOUNTER — Other Ambulatory Visit (HOSPITAL_COMMUNITY): Payer: Self-pay

## 2022-02-28 MED ORDER — LEUPROLIDE ACETATE 1 MG/0.2ML IJ KIT
PACK | INTRAMUSCULAR | 1 refills | Status: DC
  Filled 2022-02-28: qty 1, 14d supply, fill #0
  Filled 2022-03-14: qty 1, 14d supply, fill #1

## 2022-03-06 DIAGNOSIS — N926 Irregular menstruation, unspecified: Secondary | ICD-10-CM | POA: Diagnosis not present

## 2022-03-06 DIAGNOSIS — Z3200 Encounter for pregnancy test, result unknown: Secondary | ICD-10-CM | POA: Diagnosis not present

## 2022-03-06 DIAGNOSIS — E2839 Other primary ovarian failure: Secondary | ICD-10-CM | POA: Diagnosis not present

## 2022-03-06 DIAGNOSIS — Z3201 Encounter for pregnancy test, result positive: Secondary | ICD-10-CM | POA: Diagnosis not present

## 2022-03-06 DIAGNOSIS — Z3183 Encounter for assisted reproductive fertility procedure cycle: Secondary | ICD-10-CM | POA: Diagnosis not present

## 2022-03-10 ENCOUNTER — Other Ambulatory Visit (HOSPITAL_COMMUNITY): Payer: Self-pay

## 2022-03-13 DIAGNOSIS — N926 Irregular menstruation, unspecified: Secondary | ICD-10-CM | POA: Diagnosis not present

## 2022-03-13 DIAGNOSIS — E2839 Other primary ovarian failure: Secondary | ICD-10-CM | POA: Diagnosis not present

## 2022-03-13 DIAGNOSIS — Z3201 Encounter for pregnancy test, result positive: Secondary | ICD-10-CM | POA: Diagnosis not present

## 2022-03-13 DIAGNOSIS — Z3200 Encounter for pregnancy test, result unknown: Secondary | ICD-10-CM | POA: Diagnosis not present

## 2022-03-13 DIAGNOSIS — Z3183 Encounter for assisted reproductive fertility procedure cycle: Secondary | ICD-10-CM | POA: Diagnosis not present

## 2022-03-14 ENCOUNTER — Other Ambulatory Visit (HOSPITAL_COMMUNITY): Payer: Self-pay

## 2022-03-14 MED ORDER — CHORIOGONADOTROPIN ALFA 250 MCG/0.5ML ~~LOC~~ INJ
INJECTION | SUBCUTANEOUS | 0 refills | Status: DC
  Filled 2022-03-14: qty 0.5, 1d supply, fill #0

## 2022-03-17 ENCOUNTER — Other Ambulatory Visit (HOSPITAL_COMMUNITY): Payer: Self-pay

## 2022-03-17 MED ORDER — SERTRALINE HCL 50 MG PO TABS
150.0000 mg | ORAL_TABLET | Freq: Every day | ORAL | 0 refills | Status: DC
  Filled 2022-03-17: qty 90, 30d supply, fill #0

## 2022-03-31 DIAGNOSIS — N979 Female infertility, unspecified: Secondary | ICD-10-CM | POA: Diagnosis not present

## 2022-03-31 DIAGNOSIS — Z3183 Encounter for assisted reproductive fertility procedure cycle: Secondary | ICD-10-CM | POA: Diagnosis not present

## 2022-04-01 DIAGNOSIS — N912 Amenorrhea, unspecified: Secondary | ICD-10-CM | POA: Diagnosis not present

## 2022-04-08 ENCOUNTER — Other Ambulatory Visit (HOSPITAL_COMMUNITY): Payer: Self-pay

## 2022-04-09 ENCOUNTER — Other Ambulatory Visit (HOSPITAL_COMMUNITY): Payer: Self-pay

## 2022-04-09 DIAGNOSIS — N912 Amenorrhea, unspecified: Secondary | ICD-10-CM | POA: Diagnosis not present

## 2022-04-09 DIAGNOSIS — Z3183 Encounter for assisted reproductive fertility procedure cycle: Secondary | ICD-10-CM | POA: Diagnosis not present

## 2022-04-09 MED ORDER — BUPROPION HCL ER (XL) 150 MG PO TB24
150.0000 mg | ORAL_TABLET | Freq: Every day | ORAL | 0 refills | Status: DC
  Filled 2022-04-09: qty 30, 30d supply, fill #0

## 2022-04-10 ENCOUNTER — Other Ambulatory Visit (HOSPITAL_COMMUNITY): Payer: Self-pay

## 2022-04-10 MED ORDER — PENTOXIFYLLINE ER 400 MG PO TBCR
400.0000 mg | EXTENDED_RELEASE_TABLET | Freq: Three times a day (TID) | ORAL | 1 refills | Status: DC
  Filled 2022-04-10: qty 90, 30d supply, fill #0

## 2022-04-10 MED ORDER — VITAMIN E 180 MG (400 UNIT) PO CAPS
ORAL_CAPSULE | Freq: Every day | ORAL | 6 refills | Status: DC
  Filled 2022-04-10: qty 30, 30d supply, fill #0

## 2022-04-11 DIAGNOSIS — N926 Irregular menstruation, unspecified: Secondary | ICD-10-CM | POA: Diagnosis not present

## 2022-04-11 DIAGNOSIS — Z3201 Encounter for pregnancy test, result positive: Secondary | ICD-10-CM | POA: Diagnosis not present

## 2022-04-11 DIAGNOSIS — Z3183 Encounter for assisted reproductive fertility procedure cycle: Secondary | ICD-10-CM | POA: Diagnosis not present

## 2022-04-11 DIAGNOSIS — E2839 Other primary ovarian failure: Secondary | ICD-10-CM | POA: Diagnosis not present

## 2022-04-11 DIAGNOSIS — Z3200 Encounter for pregnancy test, result unknown: Secondary | ICD-10-CM | POA: Diagnosis not present

## 2022-04-12 DIAGNOSIS — Z3183 Encounter for assisted reproductive fertility procedure cycle: Secondary | ICD-10-CM | POA: Diagnosis not present

## 2022-04-14 ENCOUNTER — Ambulatory Visit: Payer: 59 | Admitting: Physician Assistant

## 2022-04-14 ENCOUNTER — Other Ambulatory Visit (HOSPITAL_COMMUNITY): Payer: Self-pay

## 2022-04-14 DIAGNOSIS — Z3183 Encounter for assisted reproductive fertility procedure cycle: Secondary | ICD-10-CM | POA: Diagnosis not present

## 2022-04-14 DIAGNOSIS — Z3189 Encounter for other procreative management: Secondary | ICD-10-CM | POA: Diagnosis not present

## 2022-04-14 DIAGNOSIS — N979 Female infertility, unspecified: Secondary | ICD-10-CM | POA: Diagnosis not present

## 2022-04-14 DIAGNOSIS — N912 Amenorrhea, unspecified: Secondary | ICD-10-CM | POA: Diagnosis not present

## 2022-04-19 DIAGNOSIS — Z3183 Encounter for assisted reproductive fertility procedure cycle: Secondary | ICD-10-CM | POA: Diagnosis not present

## 2022-04-21 ENCOUNTER — Other Ambulatory Visit (HOSPITAL_COMMUNITY): Payer: Self-pay

## 2022-04-21 DIAGNOSIS — Z0189 Encounter for other specified special examinations: Secondary | ICD-10-CM | POA: Diagnosis not present

## 2022-04-21 MED ORDER — NORETHINDRONE ACETATE 5 MG PO TABS
5.0000 mg | ORAL_TABLET | Freq: Every day | ORAL | 0 refills | Status: DC
  Filled 2022-04-21: qty 5, 5d supply, fill #0

## 2022-04-21 MED ORDER — "INSULIN SYRINGE-NEEDLE U-100 31G X 1/4"" 1 ML MISC"
1 refills | Status: DC
  Filled 2022-04-21 (×2): qty 30, 30d supply, fill #0
  Filled 2022-11-15: qty 30, 30d supply, fill #1

## 2022-05-07 ENCOUNTER — Other Ambulatory Visit (HOSPITAL_COMMUNITY): Payer: Self-pay

## 2022-05-07 MED ORDER — SERTRALINE HCL 50 MG PO TABS
150.0000 mg | ORAL_TABLET | Freq: Every day | ORAL | 0 refills | Status: DC
  Filled 2022-05-07: qty 90, 30d supply, fill #0

## 2022-05-09 DIAGNOSIS — N926 Irregular menstruation, unspecified: Secondary | ICD-10-CM | POA: Diagnosis not present

## 2022-05-09 DIAGNOSIS — Z3200 Encounter for pregnancy test, result unknown: Secondary | ICD-10-CM | POA: Diagnosis not present

## 2022-05-09 DIAGNOSIS — Z3201 Encounter for pregnancy test, result positive: Secondary | ICD-10-CM | POA: Diagnosis not present

## 2022-05-09 DIAGNOSIS — Z3183 Encounter for assisted reproductive fertility procedure cycle: Secondary | ICD-10-CM | POA: Diagnosis not present

## 2022-05-09 DIAGNOSIS — E2839 Other primary ovarian failure: Secondary | ICD-10-CM | POA: Diagnosis not present

## 2022-05-14 DIAGNOSIS — N912 Amenorrhea, unspecified: Secondary | ICD-10-CM | POA: Diagnosis not present

## 2022-05-14 DIAGNOSIS — Z3183 Encounter for assisted reproductive fertility procedure cycle: Secondary | ICD-10-CM | POA: Diagnosis not present

## 2022-05-15 ENCOUNTER — Other Ambulatory Visit (HOSPITAL_COMMUNITY): Payer: Self-pay

## 2022-05-15 MED ORDER — METHYLPREDNISOLONE 8 MG PO TABS
8.0000 mg | ORAL_TABLET | Freq: Two times a day (BID) | ORAL | 0 refills | Status: DC
  Filled 2022-05-15: qty 8, 4d supply, fill #0

## 2022-05-15 MED ORDER — BUPROPION HCL ER (XL) 150 MG PO TB24
150.0000 mg | ORAL_TABLET | Freq: Every day | ORAL | 4 refills | Status: DC
  Filled 2022-05-15: qty 90, 90d supply, fill #0
  Filled 2022-08-19: qty 90, 90d supply, fill #1
  Filled 2022-11-15: qty 90, 90d supply, fill #2
  Filled 2023-03-04: qty 90, 90d supply, fill #3

## 2022-05-16 ENCOUNTER — Ambulatory Visit: Payer: 59 | Admitting: Physician Assistant

## 2022-05-20 ENCOUNTER — Other Ambulatory Visit (HOSPITAL_COMMUNITY): Payer: Self-pay

## 2022-05-20 DIAGNOSIS — Z3141 Encounter for fertility testing: Secondary | ICD-10-CM | POA: Diagnosis not present

## 2022-05-20 DIAGNOSIS — N912 Amenorrhea, unspecified: Secondary | ICD-10-CM | POA: Diagnosis not present

## 2022-05-20 MED ORDER — DOXYCYCLINE HYCLATE 100 MG PO CAPS
100.0000 mg | ORAL_CAPSULE | Freq: Two times a day (BID) | ORAL | 0 refills | Status: DC
  Filled 2022-05-20: qty 10, 5d supply, fill #0

## 2022-05-28 DIAGNOSIS — Z760 Encounter for issue of repeat prescription: Secondary | ICD-10-CM | POA: Diagnosis not present

## 2022-05-29 ENCOUNTER — Other Ambulatory Visit (HOSPITAL_COMMUNITY): Payer: Self-pay

## 2022-05-29 MED ORDER — ESTRADIOL 0.1 MG/24HR TD PTTW
2.0000 | MEDICATED_PATCH | TRANSDERMAL | 4 refills | Status: DC
  Filled 2022-05-29: qty 8, 28d supply, fill #1
  Filled 2022-05-29: qty 8, 12d supply, fill #0
  Filled 2022-06-24: qty 8, 12d supply, fill #2
  Filled 2022-06-24: qty 8, 28d supply, fill #3
  Filled 2022-11-15: qty 8, 28d supply, fill #4
  Filled 2023-01-20: qty 8, 28d supply, fill #5
  Filled 2023-02-22: qty 8, 28d supply, fill #6

## 2022-05-30 ENCOUNTER — Other Ambulatory Visit (HOSPITAL_COMMUNITY): Payer: Self-pay

## 2022-06-02 DIAGNOSIS — N912 Amenorrhea, unspecified: Secondary | ICD-10-CM | POA: Diagnosis not present

## 2022-06-02 DIAGNOSIS — Z3183 Encounter for assisted reproductive fertility procedure cycle: Secondary | ICD-10-CM | POA: Diagnosis not present

## 2022-06-12 ENCOUNTER — Other Ambulatory Visit (HOSPITAL_COMMUNITY): Payer: Self-pay

## 2022-06-12 MED ORDER — METHYLPREDNISOLONE 8 MG PO TABS
8.0000 mg | ORAL_TABLET | Freq: Two times a day (BID) | ORAL | 0 refills | Status: DC
  Filled 2022-06-12: qty 8, 4d supply, fill #0

## 2022-06-14 DIAGNOSIS — Z3183 Encounter for assisted reproductive fertility procedure cycle: Secondary | ICD-10-CM | POA: Diagnosis not present

## 2022-06-16 ENCOUNTER — Other Ambulatory Visit (HOSPITAL_COMMUNITY): Payer: Self-pay

## 2022-06-17 ENCOUNTER — Other Ambulatory Visit (HOSPITAL_COMMUNITY): Payer: Self-pay

## 2022-06-17 MED ORDER — "NEEDLE (DISP) 23G X 1"" MISC"
2 refills | Status: DC
  Filled 2022-06-17: qty 30, 30d supply, fill #0

## 2022-06-20 ENCOUNTER — Other Ambulatory Visit (HOSPITAL_COMMUNITY): Payer: Self-pay

## 2022-06-23 ENCOUNTER — Other Ambulatory Visit (HOSPITAL_COMMUNITY): Payer: Self-pay

## 2022-06-23 DIAGNOSIS — Z3200 Encounter for pregnancy test, result unknown: Secondary | ICD-10-CM | POA: Diagnosis not present

## 2022-06-23 MED ORDER — SERTRALINE HCL 50 MG PO TABS
150.0000 mg | ORAL_TABLET | Freq: Every day | ORAL | 0 refills | Status: DC
  Filled 2022-06-23: qty 90, 30d supply, fill #0

## 2022-06-24 ENCOUNTER — Other Ambulatory Visit (HOSPITAL_COMMUNITY): Payer: Self-pay

## 2022-06-25 DIAGNOSIS — Z3201 Encounter for pregnancy test, result positive: Secondary | ICD-10-CM | POA: Diagnosis not present

## 2022-06-25 DIAGNOSIS — Z3183 Encounter for assisted reproductive fertility procedure cycle: Secondary | ICD-10-CM | POA: Diagnosis not present

## 2022-06-25 DIAGNOSIS — Z3200 Encounter for pregnancy test, result unknown: Secondary | ICD-10-CM | POA: Diagnosis not present

## 2022-06-25 DIAGNOSIS — E2839 Other primary ovarian failure: Secondary | ICD-10-CM | POA: Diagnosis not present

## 2022-06-25 DIAGNOSIS — N926 Irregular menstruation, unspecified: Secondary | ICD-10-CM | POA: Diagnosis not present

## 2022-07-18 ENCOUNTER — Encounter: Payer: Self-pay | Admitting: Neurology

## 2022-07-18 ENCOUNTER — Ambulatory Visit (INDEPENDENT_AMBULATORY_CARE_PROVIDER_SITE_OTHER): Payer: 59 | Admitting: Neurology

## 2022-07-18 VITALS — BP 135/74 | HR 100 | Resp 18 | Wt 134.0 lb

## 2022-07-18 DIAGNOSIS — R253 Fasciculation: Secondary | ICD-10-CM

## 2022-07-18 NOTE — Progress Notes (Signed)
Follow-up Visit   Date: 07/18/22   Debra Craig MRN: [id number] DOB: Mar 20, 1986   Interim History: Debra Craig is a 36 y.o. Caucasian female with anxiety returning to the clinic for follow-up of muscle twitches.  The patient was accompanied to the clinic by self.  She reports ongoing intermittent muscle twitches over the thighs, leg, and occasionally eyelid. Symptoms are always worse with stress/anxiety and is worried about ALS.  She has been seen here in the past for the same and reassured she does not have ALS.  When distracted, such as when playing with her two year-old daughter, Harland Dingwall, she is not aware of it.  In fact, when she was on vacation in the [place], she did not notice it at all.  She denies any weakness, difficulty with speech/swallow. Of note, her father also has muscle twitches.   Medications:  Current Outpatient Medications on File Prior to Visit  Medication Sig Dispense Refill   buPROPion (WELLBUTRIN XL) 150 MG 24 hr tablet Take 1 tablet (150 mg total) by mouth daily. 90 tablet 4   Insulin Syringe-Needle U-100 31G X 1/4" 1 ML MISC To use with Lupron 30 each 1   Choriogonadotropin Alfa 250 MCG/0.5ML injection Inject 1 prefilled syringe at time indicated (Patient not taking: Reported on 07/18/2022) 0.5 mL 0   doxycycline (VIBRAMYCIN) 100 MG capsule Take 1 capsule (100 mg total) by mouth 2 (two) times daily. (Patient not taking: Reported on 07/18/2022) 10 capsule 0   doxycycline (VIBRAMYCIN) 100 MG capsule Take 1 capsule (100 mg total) by mouth 2 (two) times daily. (Patient not taking: Reported on 07/18/2022) 10 capsule 0   doxycycline (VIBRAMYCIN) 100 MG capsule Take 1 capsule (100 mg total) by mouth 2 (two) times daily. (Patient not taking: Reported on 07/18/2022) 10 capsule 0   estradiol (ESTRACE) 2 MG tablet Take 1 tablet (2 mg total) by mouth 2 (two) times daily. (Patient not taking: Reported on 07/18/2022) 60 tablet 3   estradiol (ESTRACE) 2 MG tablet Take 1 tablet  (2 mg total) by mouth 2 (two) times daily. (Patient not taking: Reported on 07/18/2022) 60 tablet 3   estradiol (VIVELLE-DOT) 0.1 MG/24HR patch Place 2 patches (0.2 mg total) onto the skin every 3 (three) days as directed (Patient not taking: Reported on 07/18/2022) 16 patch 4   leuprolide (LUPRON) 1 MG/0.2ML injection Inject 10 units into the skin each evening (Patient not taking: Reported on 07/18/2022) 1 kit 1   leuprolide (LUPRON) 1 MG/0.2ML injection Inject 10 units SQ each evening (Patient not taking: Reported on 07/18/2022) 1 kit 1   medroxyPROGESTERone (PROVERA) 10 MG tablet Take 1 tablet (10 mg total) by mouth daily for 5 days starting on day 25 of estrogen (Patient not taking: Reported on 07/18/2022) 5 tablet 0   meloxicam (MOBIC) 7.5 MG tablet Take 1 tablet by mouth twice a day (Patient not taking: Reported on 07/18/2022) 60 tablet 2   methylPREDNISolone (MEDROL) 8 MG tablet Take 1 tablet (8 mg total) by mouth 2 (two) times daily. (Patient not taking: Reported on 07/18/2022) 8 tablet 0   methylPREDNISolone (MEDROL) 8 MG tablet Take 1 tablet (8 mg total) by mouth 2 (two) times daily. (Patient not taking: Reported on 07/18/2022) 8 tablet 0   NEEDLE, DISP, 22 G (B-D HYPODERMIC NEEDLE 22GX1.5") 22G X 1-1/2" MISC use to inject Progesterone (Patient not taking: Reported on 07/18/2022) 30 each 2   NEEDLE, DISP, 23 G 23G X 1" MISC Use to inject  Progesterone (Patient not taking: Reported on 07/18/2022) 30 each 2   norethindrone (AYGESTIN) 5 MG tablet Take 1 tablet (5 mg total) by mouth daily. (Patient not taking: Reported on 07/18/2022) 15 tablet 1   norethindrone (AYGESTIN) 5 MG tablet Take 1 tablet (5 mg total) by mouth daily. (Patient not taking: Reported on 07/18/2022) 5 tablet 0   pentoxifylline (TRENTAL) 400 MG CR tablet Take 1 tablet (400 mg total) by mouth 3 (three) times daily. (Patient not taking: Reported on 07/18/2022) 90 tablet 1   Prenatal Vit-Fe Fumarate-FA (PRENATAL MULTIVITAMIN) TABS tablet Take 1  tablet by mouth daily at 12 noon. (Patient not taking: Reported on 07/18/2022)     progesterone 50 MG/ML injection Inject 1 mL (50 mg total) into the muscle daily. (Patient not taking: Reported on 07/18/2022) 30 mL 2   sertraline (ZOLOFT) 50 MG tablet 50 mg. Take 3 tablets daily (Patient not taking: Reported on 07/18/2022)     sertraline (ZOLOFT) 50 MG tablet TAKE 3 TABLETS BY MOUTH DAILY 180 tablet 3   sertraline (ZOLOFT) 50 MG tablet Take 3 tablets (150 mg total) by mouth daily. (Patient not taking: Reported on 07/18/2022) 90 tablet 0   SYRINGE-NEEDLE, DISP, 3 ML (B-D 3CC LUER-LOK SYR 18GX1-1/2) 18G X 1-1/2" 3 ML MISC Use with Progesterone injections (Patient not taking: Reported on 07/18/2022) 30 each 2   vitamin E 180 MG (400 UNITS) capsule Take 1 tablet by mouth daily. (Patient not taking: Reported on 07/18/2022) 30 capsule 6   No current facility-administered medications on file prior to visit.    Allergies:  Allergies  Allergen Reactions   Penicillins     Did it involve swelling of the face/tongue/throat, SOB, or low BP? No Did it involve sudden or severe rash/hives, skin peeling, or any reaction on the inside of your mouth or nose? no Did you need to seek medical attention at a hospital or doctor's office? No  When did it last happen?       If all above answers are "NO", may proceed with cephalosporin use.    Vital Signs:  BP 135/74   Pulse 100   Resp 18   Wt 134 lb (60.8 kg)   SpO2 98%   BMI 22.30 kg/m    Neurological Exam: MENTAL STATUS including orientation to time, place, person, recent and remote memory, attention span and concentration, language, and fund of knowledge is normal.  Speech is not dysarthric.  CRANIAL NERVES:  No visual field defects.  Pupils equal round and reactive to light.  Normal conjugate, extra-ocular eye movements in all directions of gaze.  No ptosis.  Face is symmetric. Palate elevates symmetrically.  Tongue is midline.  MOTOR:  Motor strength is 5/5  in all extremities.  No atrophy, fasciculations or abnormal movements.  No pronator drift.  Tone is normal.    MSRs:  Reflexes are 2+/4 throughout.  SENSORY:  Intact to vibration  COORDINATION/GAIT:  Gait narrow based and stable. Stressed gait intact.   Data: n/a  IMPRESSION/PLAN: Benign fasciculations.  Patient reassured she does not have ALS and twitches are benign and exacerbated by stress/anxiety.  Stress management strategies such as mindfulness discussed.  Patient very appreciative for the visit.  Return to clinic as needed  Thank you for allowing me to participate in patient's care.  If I can answer any additional questions, I would be pleased to do so.    Sincerely,    Dorr Perrot K. Posey Pronto, DO

## 2022-07-29 DIAGNOSIS — M6751 Plica syndrome, right knee: Secondary | ICD-10-CM | POA: Diagnosis not present

## 2022-08-01 DIAGNOSIS — Z3200 Encounter for pregnancy test, result unknown: Secondary | ICD-10-CM | POA: Diagnosis not present

## 2022-08-01 DIAGNOSIS — Z3183 Encounter for assisted reproductive fertility procedure cycle: Secondary | ICD-10-CM | POA: Diagnosis not present

## 2022-08-01 DIAGNOSIS — N926 Irregular menstruation, unspecified: Secondary | ICD-10-CM | POA: Diagnosis not present

## 2022-08-01 DIAGNOSIS — Z3201 Encounter for pregnancy test, result positive: Secondary | ICD-10-CM | POA: Diagnosis not present

## 2022-08-01 DIAGNOSIS — E2839 Other primary ovarian failure: Secondary | ICD-10-CM | POA: Diagnosis not present

## 2022-08-08 ENCOUNTER — Other Ambulatory Visit (HOSPITAL_COMMUNITY): Payer: Self-pay

## 2022-08-08 DIAGNOSIS — N912 Amenorrhea, unspecified: Secondary | ICD-10-CM | POA: Diagnosis not present

## 2022-08-08 DIAGNOSIS — Z3201 Encounter for pregnancy test, result positive: Secondary | ICD-10-CM | POA: Diagnosis not present

## 2022-08-08 DIAGNOSIS — N926 Irregular menstruation, unspecified: Secondary | ICD-10-CM | POA: Diagnosis not present

## 2022-08-08 DIAGNOSIS — Z3183 Encounter for assisted reproductive fertility procedure cycle: Secondary | ICD-10-CM | POA: Diagnosis not present

## 2022-08-08 DIAGNOSIS — E2839 Other primary ovarian failure: Secondary | ICD-10-CM | POA: Diagnosis not present

## 2022-08-08 DIAGNOSIS — Z3200 Encounter for pregnancy test, result unknown: Secondary | ICD-10-CM | POA: Diagnosis not present

## 2022-08-08 MED ORDER — METHYLPREDNISOLONE 4 MG PO TBPK
ORAL_TABLET | ORAL | 0 refills | Status: DC
  Filled 2022-08-08: qty 21, 6d supply, fill #0

## 2022-08-11 DIAGNOSIS — M7651 Patellar tendinitis, right knee: Secondary | ICD-10-CM | POA: Diagnosis not present

## 2022-08-12 ENCOUNTER — Other Ambulatory Visit (HOSPITAL_COMMUNITY): Payer: Self-pay

## 2022-08-12 MED ORDER — DOXYCYCLINE HYCLATE 100 MG PO CAPS
100.0000 mg | ORAL_CAPSULE | Freq: Two times a day (BID) | ORAL | 0 refills | Status: DC
  Filled 2022-08-12: qty 10, 5d supply, fill #0

## 2022-08-14 ENCOUNTER — Other Ambulatory Visit (HOSPITAL_COMMUNITY): Payer: Self-pay

## 2022-08-14 MED ORDER — SERTRALINE HCL 50 MG PO TABS
150.0000 mg | ORAL_TABLET | Freq: Every day | ORAL | 0 refills | Status: DC
  Filled 2022-08-14: qty 270, 90d supply, fill #0

## 2022-08-15 ENCOUNTER — Other Ambulatory Visit (HOSPITAL_COMMUNITY): Payer: Self-pay

## 2022-08-19 ENCOUNTER — Other Ambulatory Visit (HOSPITAL_COMMUNITY): Payer: Self-pay

## 2022-09-01 DIAGNOSIS — H5213 Myopia, bilateral: Secondary | ICD-10-CM | POA: Diagnosis not present

## 2022-09-03 DIAGNOSIS — Z3141 Encounter for fertility testing: Secondary | ICD-10-CM | POA: Diagnosis not present

## 2022-09-03 DIAGNOSIS — N912 Amenorrhea, unspecified: Secondary | ICD-10-CM | POA: Diagnosis not present

## 2022-10-13 DIAGNOSIS — Z1389 Encounter for screening for other disorder: Secondary | ICD-10-CM | POA: Diagnosis not present

## 2022-10-13 DIAGNOSIS — Z13228 Encounter for screening for other metabolic disorders: Secondary | ICD-10-CM | POA: Diagnosis not present

## 2022-10-13 DIAGNOSIS — O99343 Other mental disorders complicating pregnancy, third trimester: Secondary | ICD-10-CM | POA: Diagnosis not present

## 2022-10-13 DIAGNOSIS — Z01419 Encounter for gynecological examination (general) (routine) without abnormal findings: Secondary | ICD-10-CM | POA: Diagnosis not present

## 2022-10-13 DIAGNOSIS — Z130 Encounter for screening for diseases of the blood and blood-forming organs and certain disorders involving the immune mechanism: Secondary | ICD-10-CM | POA: Diagnosis not present

## 2022-10-13 DIAGNOSIS — Z13220 Encounter for screening for lipoid disorders: Secondary | ICD-10-CM | POA: Diagnosis not present

## 2022-10-13 DIAGNOSIS — E559 Vitamin D deficiency, unspecified: Secondary | ICD-10-CM | POA: Diagnosis not present

## 2022-10-14 ENCOUNTER — Other Ambulatory Visit (HOSPITAL_COMMUNITY): Payer: Self-pay

## 2022-10-14 MED ORDER — BUPROPION HCL ER (XL) 150 MG PO TB24
150.0000 mg | ORAL_TABLET | Freq: Every day | ORAL | 4 refills | Status: DC
  Filled 2022-10-14: qty 90, 90d supply, fill #0

## 2022-10-14 MED ORDER — SERTRALINE HCL 50 MG PO TABS
100.0000 mg | ORAL_TABLET | Freq: Every day | ORAL | 3 refills | Status: DC
  Filled 2022-10-14 – 2022-11-15 (×3): qty 180, 90d supply, fill #0
  Filled 2023-03-04: qty 180, 90d supply, fill #1
  Filled 2023-07-15: qty 180, 90d supply, fill #2
  Filled 2023-10-10: qty 180, 90d supply, fill #3

## 2022-10-15 ENCOUNTER — Other Ambulatory Visit (HOSPITAL_COMMUNITY): Payer: Self-pay

## 2022-10-15 DIAGNOSIS — Z13228 Encounter for screening for other metabolic disorders: Secondary | ICD-10-CM | POA: Diagnosis not present

## 2022-10-15 DIAGNOSIS — Z13220 Encounter for screening for lipoid disorders: Secondary | ICD-10-CM | POA: Diagnosis not present

## 2022-10-15 DIAGNOSIS — Z130 Encounter for screening for diseases of the blood and blood-forming organs and certain disorders involving the immune mechanism: Secondary | ICD-10-CM | POA: Diagnosis not present

## 2022-10-15 DIAGNOSIS — E559 Vitamin D deficiency, unspecified: Secondary | ICD-10-CM | POA: Diagnosis not present

## 2022-10-26 DIAGNOSIS — J020 Streptococcal pharyngitis: Secondary | ICD-10-CM | POA: Diagnosis not present

## 2022-11-04 ENCOUNTER — Other Ambulatory Visit (HOSPITAL_COMMUNITY): Payer: Self-pay

## 2022-11-04 MED ORDER — MEDROXYPROGESTERONE ACETATE 10 MG PO TABS
10.0000 mg | ORAL_TABLET | Freq: Every day | ORAL | 0 refills | Status: DC
  Filled 2022-11-04: qty 10, 10d supply, fill #0

## 2022-11-17 ENCOUNTER — Other Ambulatory Visit: Payer: Self-pay

## 2022-11-17 ENCOUNTER — Other Ambulatory Visit (HOSPITAL_COMMUNITY): Payer: Self-pay

## 2022-11-18 ENCOUNTER — Other Ambulatory Visit (HOSPITAL_COMMUNITY): Payer: Self-pay

## 2022-11-28 ENCOUNTER — Other Ambulatory Visit (HOSPITAL_COMMUNITY): Payer: Self-pay

## 2022-12-01 NOTE — L&D Delivery Note (Addendum)
DELIVERY NOTE  Pt complete and at +2 station with urge to push. Epidural controlling pain. Pt pushed and delivered a viable female infant in LOA position. Variable deceleration noted with last two contractions. Verbally consented for VAVD, however infant delivered with subsequent contraction and excellent maternal effort. Double nuchal noted, one loop easily reduced at perineum. Baby then delivered through second loop, revealed additional body cord as well. Anterior and posterior shoulders spontaneously delivered with next two pushes; body easily followed next. Infant placed on mothers abdomen and bulb suction of mouth and nose performed. HR and tone noted but tone somewhat weak and weak cry. Cord was then clamped and cut by MD. Cord blood obtained, 3VC. Baby taken to warmer for stimulation, still with poor effort/cry therefore NICU paged. 5 breaths PPV given. Baby had good effort at this point, few seconds of CPAP were needed but baby was then able to be transferred to mother's chest per NICU. Placenta then delivered at 2008 intact. Fundal massage performed and pitocin per protocol. Fundus firm. The following lacerations were noted: 2nd deg. Repaired in routine fashion with 2-0 vicryl. Mother and baby stable. Counts correct. Ebl 301cc  Infant time: 2004 Gender: female Placenta time: 2008 Apgars: 6/8 Weight: pending skin-to-skin

## 2022-12-09 ENCOUNTER — Other Ambulatory Visit (HOSPITAL_COMMUNITY): Payer: Self-pay

## 2022-12-09 MED ORDER — PROGESTERONE 50 MG/ML IM OIL
50.0000 mg | TOPICAL_OIL | Freq: Every day | INTRAMUSCULAR | 4 refills | Status: DC
  Filled 2022-12-09: qty 30, 30d supply, fill #0
  Filled 2023-03-04: qty 30, 30d supply, fill #1

## 2022-12-09 MED ORDER — "BD LUER-LOK SYRINGE 18G X 1-1/2"" 3 ML MISC"
3 refills | Status: DC
  Filled 2022-12-09: qty 30, 30d supply, fill #0
  Filled 2023-03-04: qty 30, 30d supply, fill #1

## 2022-12-09 MED ORDER — "NEEDLE (DISP) 22G X 1-1/2"" MISC"
3 refills | Status: DC
  Filled 2022-12-09: qty 30, 30d supply, fill #0
  Filled 2023-03-04: qty 30, 30d supply, fill #1

## 2022-12-10 ENCOUNTER — Other Ambulatory Visit (HOSPITAL_COMMUNITY): Payer: Self-pay

## 2022-12-10 DIAGNOSIS — Z3201 Encounter for pregnancy test, result positive: Secondary | ICD-10-CM | POA: Diagnosis not present

## 2022-12-10 DIAGNOSIS — N926 Irregular menstruation, unspecified: Secondary | ICD-10-CM | POA: Diagnosis not present

## 2022-12-10 DIAGNOSIS — Z3183 Encounter for assisted reproductive fertility procedure cycle: Secondary | ICD-10-CM | POA: Diagnosis not present

## 2022-12-10 DIAGNOSIS — Z3200 Encounter for pregnancy test, result unknown: Secondary | ICD-10-CM | POA: Diagnosis not present

## 2022-12-10 DIAGNOSIS — E2839 Other primary ovarian failure: Secondary | ICD-10-CM | POA: Diagnosis not present

## 2022-12-10 MED ORDER — METHYLPREDNISOLONE 4 MG PO TABS
ORAL_TABLET | ORAL | 0 refills | Status: AC
  Filled 2022-12-10: qty 21, 6d supply, fill #0

## 2022-12-11 ENCOUNTER — Other Ambulatory Visit (HOSPITAL_COMMUNITY): Payer: Self-pay

## 2023-01-06 ENCOUNTER — Other Ambulatory Visit (HOSPITAL_COMMUNITY): Payer: Self-pay

## 2023-01-06 DIAGNOSIS — Z3183 Encounter for assisted reproductive fertility procedure cycle: Secondary | ICD-10-CM | POA: Diagnosis not present

## 2023-01-06 DIAGNOSIS — E2839 Other primary ovarian failure: Secondary | ICD-10-CM | POA: Diagnosis not present

## 2023-01-06 DIAGNOSIS — Z3201 Encounter for pregnancy test, result positive: Secondary | ICD-10-CM | POA: Diagnosis not present

## 2023-01-06 DIAGNOSIS — N926 Irregular menstruation, unspecified: Secondary | ICD-10-CM | POA: Diagnosis not present

## 2023-01-06 DIAGNOSIS — Z3200 Encounter for pregnancy test, result unknown: Secondary | ICD-10-CM | POA: Diagnosis not present

## 2023-01-06 MED ORDER — MEDROXYPROGESTERONE ACETATE 10 MG PO TABS
10.0000 mg | ORAL_TABLET | Freq: Every day | ORAL | 2 refills | Status: DC
  Filled 2023-01-06: qty 10, 10d supply, fill #0

## 2023-01-07 ENCOUNTER — Other Ambulatory Visit (HOSPITAL_COMMUNITY): Payer: Self-pay

## 2023-01-20 ENCOUNTER — Other Ambulatory Visit (HOSPITAL_COMMUNITY): Payer: Self-pay

## 2023-01-20 ENCOUNTER — Other Ambulatory Visit: Payer: Self-pay

## 2023-01-20 MED ORDER — ESTRADIOL 2 MG PO TABS
2.0000 mg | ORAL_TABLET | Freq: Two times a day (BID) | ORAL | 3 refills | Status: DC
  Filled 2023-01-20: qty 60, 30d supply, fill #0

## 2023-02-03 ENCOUNTER — Other Ambulatory Visit (HOSPITAL_COMMUNITY): Payer: Self-pay

## 2023-02-03 MED ORDER — METHYLPREDNISOLONE 8 MG PO TABS
8.0000 mg | ORAL_TABLET | Freq: Two times a day (BID) | ORAL | 0 refills | Status: DC
  Filled 2023-02-03: qty 8, 4d supply, fill #0

## 2023-02-18 DIAGNOSIS — Z3201 Encounter for pregnancy test, result positive: Secondary | ICD-10-CM | POA: Diagnosis not present

## 2023-02-18 DIAGNOSIS — Z3200 Encounter for pregnancy test, result unknown: Secondary | ICD-10-CM | POA: Diagnosis not present

## 2023-02-23 ENCOUNTER — Other Ambulatory Visit (HOSPITAL_COMMUNITY): Payer: Self-pay

## 2023-02-23 MED ORDER — ESTRADIOL 0.1 MG/24HR TD PTTW
1.0000 | MEDICATED_PATCH | TRANSDERMAL | 3 refills | Status: DC
  Filled 2023-02-23 – 2023-03-05 (×3): qty 8, 24d supply, fill #0
  Filled 2023-03-19: qty 8, 24d supply, fill #1

## 2023-03-05 ENCOUNTER — Other Ambulatory Visit (HOSPITAL_COMMUNITY): Payer: Self-pay

## 2023-03-05 ENCOUNTER — Other Ambulatory Visit: Payer: Self-pay

## 2023-03-06 ENCOUNTER — Other Ambulatory Visit: Payer: Self-pay

## 2023-03-06 DIAGNOSIS — Z3200 Encounter for pregnancy test, result unknown: Secondary | ICD-10-CM | POA: Diagnosis not present

## 2023-03-14 ENCOUNTER — Encounter (HOSPITAL_COMMUNITY): Payer: Self-pay | Admitting: Obstetrics and Gynecology

## 2023-03-14 ENCOUNTER — Inpatient Hospital Stay (HOSPITAL_COMMUNITY)
Admission: AD | Admit: 2023-03-14 | Discharge: 2023-03-14 | Disposition: A | Discharge status: Home / Self Care | Payer: 59 | Attending: Obstetrics and Gynecology | Admitting: Obstetrics and Gynecology

## 2023-03-14 ENCOUNTER — Inpatient Hospital Stay (HOSPITAL_COMMUNITY): Payer: 59

## 2023-03-14 DIAGNOSIS — O99341 Other mental disorders complicating pregnancy, first trimester: Secondary | ICD-10-CM | POA: Insufficient documentation

## 2023-03-14 DIAGNOSIS — O09521 Supervision of elderly multigravida, first trimester: Secondary | ICD-10-CM | POA: Insufficient documentation

## 2023-03-14 DIAGNOSIS — R109 Unspecified abdominal pain: Secondary | ICD-10-CM

## 2023-03-14 DIAGNOSIS — O208 Other hemorrhage in early pregnancy: Secondary | ICD-10-CM | POA: Diagnosis not present

## 2023-03-14 DIAGNOSIS — Z3A01 Less than 8 weeks gestation of pregnancy: Secondary | ICD-10-CM | POA: Diagnosis not present

## 2023-03-14 DIAGNOSIS — F419 Anxiety disorder, unspecified: Secondary | ICD-10-CM | POA: Diagnosis not present

## 2023-03-14 DIAGNOSIS — Z3491 Encounter for supervision of normal pregnancy, unspecified, first trimester: Secondary | ICD-10-CM

## 2023-03-14 DIAGNOSIS — O26891 Other specified pregnancy related conditions, first trimester: Secondary | ICD-10-CM | POA: Insufficient documentation

## 2023-03-14 LAB — URINALYSIS, ROUTINE W REFLEX MICROSCOPIC
Bilirubin Urine: NEGATIVE
Glucose, UA: NEGATIVE mg/dL
Hgb urine dipstick: NEGATIVE
Ketones, ur: NEGATIVE mg/dL
Leukocytes,Ua: NEGATIVE
Nitrite: NEGATIVE
Protein, ur: NEGATIVE mg/dL
Specific Gravity, Urine: 1.001 — ABNORMAL LOW (ref 1.005–1.030)
pH: 6 (ref 5.0–8.0)

## 2023-03-14 NOTE — Discharge Instructions (Signed)

## 2023-03-14 NOTE — [facility] Note (Signed)
.  Debra Craig is a 37 y.o. at [redacted]w[redacted]d here in [facility]U reporting: IVF pregnancy, states she has had cramping throughout the day and was very worried. Had an ultrasound recently and baby was measuring accurately with dating and had a heartbeat. Denies VB or recent intercourse.   Pain score: 2 Vitals:   03/14/23 1747  BP: 130/68  Pulse: 74  Resp: 14  Temp: 97.7 F (36.5 C)  SpO2: 100%

## 2023-03-14 NOTE — MAU Provider Note (Signed)
History     CSN: [id number]  Arrival date and time: 03/14/23 1728   None     Chief Complaint  Patient presents with   Abdominal Pain   HPI  Debra Craig is a 37 y.o. G2P1001 at [redacted]w[redacted]d who presents for evaluation of lower abdominal cramping. Patient reports this cramping comes and goes throughout the day. Patient rates the pain as a 2/10 and has not tried anything for the pain. She denies any vaginal bleeding, discharge, and leaking of fluid. Denies any constipation, diarrhea or any urinary complaints.   She reports this is an IVF pregnancy and she is just really worried. She reports she had a live IUP consistent with dates on ultrasound on Thursday at Dr. Lenoria Farrier office.   OB History     Gravida  2   Para  1   Term  1   Preterm      AB      Living  1      SAB      IAB      Ectopic      Multiple  0   Live Births  1           Past Medical History:  Diagnosis Date   Acne    Anxiety    Elevated LDL cholesterol level    Liver hemangioma    Muscle twitching    Vitamin D deficiency     Past Surgical History:  Procedure Laterality Date   IUI     KNEE ARTHROSCOPY WITH EXCISION PLICA Right 09/05/2021   Procedure: KNEE ARTHROSCOPY WITH EXCISION PLICA;  Surgeon: Bjorn Pippin, MD;  Location: [facility];  Service: Orthopedics;  Laterality: Right;   vaginal delievery     WISDOM TOOTH EXTRACTION      No family history on file.  Social History   Tobacco Use   Smoking status: Never   Smokeless tobacco: Never  Vaping Use   Vaping Use: Never used  Substance Use Topics   Alcohol use: Yes    Comment: Social Drinking   Drug use: No    Allergies:  Allergies  Allergen Reactions   Penicillins     Did it involve swelling of the face/tongue/throat, SOB, or low BP? No Did it involve sudden or severe rash/hives, skin peeling, or any reaction on the inside of your mouth or nose? no Did you need to seek medical attention at a hospital or doctor's  office? No  When did it last happen?       If all above answers are "NO", may proceed with cephalosporin use.    Medications Prior to Admission  Medication Sig Dispense Refill Last Dose   buPROPion (WELLBUTRIN XL) 150 MG 24 hr tablet Take 1 tablet (150 mg total) by mouth daily. 90 tablet 4    buPROPion (WELLBUTRIN XL) 150 MG 24 hr tablet Take 1 tablet (150 mg total) by mouth daily. 90 tablet 4    Choriogonadotropin Alfa 250 MCG/0.5ML injection Inject 1 prefilled syringe at time indicated (Patient not taking: Reported on 07/18/2022) 0.5 mL 0    doxycycline (VIBRAMYCIN) 100 MG capsule Take 1 capsule (100 mg total) by mouth 2 (two) times daily. (Patient not taking: Reported on 07/18/2022) 10 capsule 0    doxycycline (VIBRAMYCIN) 100 MG capsule Take 1 capsule (100 mg total) by mouth 2 (two) times daily. (Patient not taking: Reported on 07/18/2022) 10 capsule 0    doxycycline (VIBRAMYCIN) 100 MG capsule Take  1 capsule (100 mg total) by mouth 2 (two) times daily. 10 capsule 0    estradiol (ESTRACE) 2 MG tablet Take 1 tablet (2 mg total) by mouth 2 (two) times daily. (Patient not taking: Reported on 07/18/2022) 60 tablet 3    estradiol (ESTRACE) 2 MG tablet Take 1 tablet (2 mg total) by mouth 2 (two) times daily. 60 tablet 3    estradiol (VIVELLE-DOT) 0.1 MG/24HR patch Place 2 patches (0.2 mg total) onto the skin every 3 (three) days as directed (Patient not taking: Reported on 07/18/2022) 16 patch 4    estradiol (VIVELLE-DOT) 0.1 MG/24HR patch Place 1 patch (0.1 mg total) onto the skin every 3 (three) days. 8 patch 3    Insulin Syringe-Needle U-100 31G X 1/4" 1 ML MISC To use with Lupron 30 each 1    leuprolide (LUPRON) 1 MG/0.2ML injection Inject 10 units into the skin each evening (Patient not taking: Reported on 07/18/2022) 1 kit 1    leuprolide (LUPRON) 1 MG/0.2ML injection Inject 10 units SQ each evening (Patient not taking: Reported on 07/18/2022) 1 kit 1    medroxyPROGESTERone (PROVERA) 10 MG tablet  Take 1 tablet (10 mg total) by mouth daily for 5 days starting on day 25 of estrogen (Patient not taking: Reported on 07/18/2022) 5 tablet 0    medroxyPROGESTERone (PROVERA) 10 MG tablet Take 1 tablet (10 mg total) by mouth daily for 10 days 10 tablet 2    meloxicam (MOBIC) 7.5 MG tablet Take 1 tablet by mouth twice a day (Patient not taking: Reported on 07/18/2022) 60 tablet 2    methylPREDNISolone (MEDROL) 4 MG TBPK tablet Take as directed for 6 days. 21 each 0    methylPREDNISolone (MEDROL) 8 MG tablet Take 1 tablet (8 mg total) by mouth 2 (two) times daily. (Patient not taking: Reported on 07/18/2022) 8 tablet 0    methylPREDNISolone (MEDROL) 8 MG tablet Take 1 tablet (8 mg total) by mouth 2 (two) times daily. (Patient not taking: Reported on 07/18/2022) 8 tablet 0    methylPREDNISolone (MEDROL) 8 MG tablet Take 1 tablet (8 mg total) by mouth 2 (two) times daily. 8 tablet 0    NEEDLE, DISP, 22 G (B-D HYPODERMIC NEEDLE 22GX1.5") 22G X 1-1/2" MISC use to inject Progesterone (Patient not taking: Reported on 07/18/2022) 30 each 2    NEEDLE, DISP, 22 G 22G X 1-1/2" MISC Use to inject Progesterone 30 each 3    NEEDLE, DISP, 23 G 23G X 1" MISC Use to inject Progesterone (Patient not taking: Reported on 07/18/2022) 30 each 2    norethindrone (AYGESTIN) 5 MG tablet Take 1 tablet (5 mg total) by mouth daily. (Patient not taking: Reported on 07/18/2022) 15 tablet 1    norethindrone (AYGESTIN) 5 MG tablet Take 1 tablet (5 mg total) by mouth daily. (Patient not taking: Reported on 07/18/2022) 5 tablet 0    pentoxifylline (TRENTAL) 400 MG CR tablet Take 1 tablet (400 mg total) by mouth 3 (three) times daily. (Patient not taking: Reported on 07/18/2022) 90 tablet 1    Prenatal Vit-Fe Fumarate-FA (PRENATAL MULTIVITAMIN) TABS tablet Take 1 tablet by mouth daily at 12 noon. (Patient not taking: Reported on 07/18/2022)      progesterone 50 MG/ML injection Inject 1 mL (50 mg total) into the muscle daily. (Patient not taking:  Reported on 07/18/2022) 30 mL 2    progesterone 50 MG/ML injection Inject 1 mL (50 mg total) into the muscle daily as directed 30 mL 4  sertraline (ZOLOFT) 50 MG tablet 50 mg. Take 3 tablets daily (Patient not taking: Reported on 07/18/2022)      sertraline (ZOLOFT) 50 MG tablet TAKE 3 TABLETS BY MOUTH DAILY 180 tablet 3    sertraline (ZOLOFT) 50 MG tablet Take 2 tablets (100 mg total) by mouth daily. 180 tablet 3    SYRINGE-NEEDLE, DISP, 3 ML (B-D 3CC LUER-LOK SYR 18GX1-1/2) 18G X 1-1/2" 3 ML MISC Use with Progesterone injections (Patient not taking: Reported on 07/18/2022) 30 each 2    SYRINGE-NEEDLE, DISP, 3 ML (B-D 3CC LUER-LOK SYR 18GX1-1/2) 18G X 1-1/2" 3 ML MISC Use to draw out Progesterone 30 each 3    vitamin E 180 MG (400 UNITS) capsule Take 1 tablet by mouth daily. (Patient not taking: Reported on 07/18/2022) 30 capsule 6     Review of Systems  Constitutional: Negative.  Negative for fatigue and fever.  HENT: Negative.    Respiratory: Negative.  Negative for shortness of breath.   Cardiovascular: Negative.  Negative for chest pain.  Gastrointestinal:  Positive for abdominal pain. Negative for constipation, diarrhea, nausea and vomiting.  Genitourinary: Negative.  Negative for dysuria, vaginal bleeding and vaginal discharge.  Neurological: Negative.  Negative for dizziness and headaches.   Physical Exam   Blood pressure 130/68, pulse 74, temperature 97.7 F (36.5 C), temperature source Oral, resp. rate 14, height 5\' 6"  (1.676 m), weight 64 kg, SpO2 100 %.  Patient Vitals for the past 24 hrs:  BP Temp Temp src Pulse Resp SpO2 Height Weight  03/14/23 1747 130/68 97.7 F (36.5 C) Oral 74 14 100 % 5\' 6"  (1.676 m) 64 kg    Physical Exam Vitals and nursing note reviewed.  Constitutional:      General: She is not in acute distress.    Appearance: She is well-developed.  HENT:     Head: Normocephalic.  Eyes:     Pupils: Pupils are equal, round, and reactive to light.   Cardiovascular:     Rate and Rhythm: Normal rate and regular rhythm.     Heart sounds: Normal heart sounds.  Pulmonary:     Effort: Pulmonary effort is normal. No respiratory distress.     Breath sounds: Normal breath sounds.  Abdominal:     General: Bowel sounds are normal. There is no distension.     Palpations: Abdomen is soft.     Tenderness: There is no abdominal tenderness.  Skin:    General: Skin is warm and dry.  Neurological:     Mental Status: She is alert and oriented to person, place, and time.  Psychiatric:        Mood and Affect: Mood normal.        Behavior: Behavior normal.        Thought Content: Thought content normal.        Judgment: Judgment normal.      MAU Course  Procedures  Results for orders placed or performed during the hospital encounter of 03/14/23 (from the past 24 hour(s))  Urinalysis, Routine w reflex microscopic -Urine, Clean Catch     Status: Abnormal   Collection Time: 03/14/23  5:59 PM  Result Value Ref Range   Color, Urine COLORLESS (A) YELLOW   APPearance CLEAR CLEAR   Specific Gravity, Urine 1.001 (L) 1.005 - 1.030   pH 6.0 5.0 - 8.0   Glucose, UA NEGATIVE NEGATIVE mg/dL   Hgb urine dipstick NEGATIVE NEGATIVE   Bilirubin Urine NEGATIVE NEGATIVE   Ketones, ur NEGATIVE  NEGATIVE mg/dL   Protein, ur NEGATIVE NEGATIVE mg/dL   Nitrite NEGATIVE NEGATIVE   Leukocytes,Ua NEGATIVE NEGATIVE     US OB LESS THAN 14 WEEKS WITH OB TRANSVAGINAL  Result Date: 03/14/2023 CLINICAL DATA:  Initial evaluation for acute abdominal pain, early pregnancy. EXAM: OBSTETRIC <14 WK [place] AND TRANSVAGINAL OB US TECHNIQUE: Both transabdominal and transvaginal ultrasound examinations were performed for complete evaluation of the gestation as well as the maternal uterus, adnexal regions, and pelvic cul-de-sac. Transvaginal technique was performed to assess early pregnancy. COMPARISON:  None Available. FINDINGS: Intrauterine gestational sac: Single Yolk sac:  Present  Embryo:  Present Cardiac Activity: Present Heart Rate: 146 bpm CRL:  10.2 mm   7 w   1 d                  [place] EDC: 10/30/2023. Subchorionic hemorrhage: Small subchorionic hemorrhage without mass effect. Maternal uterus/adnexae: Ovaries within normal limits. No adnexal mass or free fluid. IMPRESSION: 1. Single viable IUP, estimated gestational age [redacted] weeks and 1 day by crown-rump length, with ultrasound EDC of 10/30/2023. 2. Small subchorionic hemorrhage without mass effect. 3. No other acute maternal uterine or adnexal abnormality. Electronically Signed   By: Rise Mu M.D.   On: 03/14/2023 18:37     MDM Labs ordered and reviewed.   UA US OB Transvaginal  CNM independently reviewed the imaging ordered. Imaging show live IUP consistent with dates  Assessment and Plan   1. Normal intrauterine pregnancy on prenatal ultrasound in first trimester   2. [redacted] weeks gestation of pregnancy     -Discharge home in stable condition -First trimester precautions discussed -Patient advised to follow-up with OB as scheduled for prenatal care -Patient may return to MAU as needed or if her condition were to change or worsen  Rolm Bookbinder, CNM 03/14/2023, 6:41 PM

## 2023-03-16 ENCOUNTER — Other Ambulatory Visit (HOSPITAL_COMMUNITY): Payer: Self-pay

## 2023-03-16 MED ORDER — PROGESTERONE 200 MG PO CAPS
200.0000 mg | ORAL_CAPSULE | Freq: Every day | ORAL | 0 refills | Status: DC
  Filled 2023-03-16 – 2023-03-30 (×2): qty 14, 14d supply, fill #0

## 2023-03-19 ENCOUNTER — Other Ambulatory Visit (HOSPITAL_COMMUNITY): Payer: Self-pay

## 2023-03-19 DIAGNOSIS — Z3A08 8 weeks gestation of pregnancy: Secondary | ICD-10-CM | POA: Diagnosis not present

## 2023-03-19 DIAGNOSIS — O26891 Other specified pregnancy related conditions, first trimester: Secondary | ICD-10-CM | POA: Diagnosis not present

## 2023-03-24 DIAGNOSIS — Z113 Encounter for screening for infections with a predominantly sexual mode of transmission: Secondary | ICD-10-CM | POA: Diagnosis not present

## 2023-03-24 DIAGNOSIS — Z369 Encounter for antenatal screening, unspecified: Secondary | ICD-10-CM | POA: Diagnosis not present

## 2023-03-24 DIAGNOSIS — O09521 Supervision of elderly multigravida, first trimester: Secondary | ICD-10-CM | POA: Diagnosis not present

## 2023-03-24 DIAGNOSIS — O09811 Supervision of pregnancy resulting from assisted reproductive technology, first trimester: Secondary | ICD-10-CM | POA: Diagnosis not present

## 2023-03-24 DIAGNOSIS — Z3A08 8 weeks gestation of pregnancy: Secondary | ICD-10-CM | POA: Diagnosis not present

## 2023-03-26 ENCOUNTER — Other Ambulatory Visit (HOSPITAL_COMMUNITY): Payer: Self-pay

## 2023-03-27 LAB — HEPATITIS C ANTIBODY: HCV Ab: NEGATIVE

## 2023-03-27 LAB — OB RESULTS CONSOLE GC/CHLAMYDIA
Chlamydia: NEGATIVE
Neisseria Gonorrhea: NEGATIVE

## 2023-03-27 LAB — OB RESULTS CONSOLE HEPATITIS B SURFACE ANTIGEN: Hepatitis B Surface Ag: NEGATIVE

## 2023-03-27 LAB — OB RESULTS CONSOLE RPR: RPR: NONREACTIVE

## 2023-03-27 LAB — OB RESULTS CONSOLE RUBELLA ANTIBODY, IGM: Rubella: IMMUNE

## 2023-03-27 LAB — OB RESULTS CONSOLE HIV ANTIBODY (ROUTINE TESTING): HIV: NONREACTIVE

## 2023-03-30 ENCOUNTER — Other Ambulatory Visit (HOSPITAL_COMMUNITY): Payer: Self-pay

## 2023-03-31 ENCOUNTER — Other Ambulatory Visit (HOSPITAL_COMMUNITY): Payer: Self-pay

## 2023-04-02 DIAGNOSIS — O09521 Supervision of elderly multigravida, first trimester: Secondary | ICD-10-CM | POA: Diagnosis not present

## 2023-04-02 DIAGNOSIS — Z3A08 8 weeks gestation of pregnancy: Secondary | ICD-10-CM | POA: Diagnosis not present

## 2023-05-07 ENCOUNTER — Other Ambulatory Visit (HOSPITAL_COMMUNITY): Payer: Self-pay

## 2023-05-07 MED ORDER — BUTALBITAL-APAP-CAFFEINE 50-300-40 MG PO CAPS
1.0000 | ORAL_CAPSULE | Freq: Four times a day (QID) | ORAL | 1 refills | Status: DC | PRN
  Filled 2023-05-07 – 2023-06-01 (×2): qty 20, 5d supply, fill #0

## 2023-05-20 ENCOUNTER — Other Ambulatory Visit (HOSPITAL_COMMUNITY): Payer: Self-pay

## 2023-06-01 ENCOUNTER — Other Ambulatory Visit: Payer: Self-pay

## 2023-06-01 ENCOUNTER — Other Ambulatory Visit (HOSPITAL_COMMUNITY): Payer: Self-pay

## 2023-06-01 DIAGNOSIS — Z3A18 18 weeks gestation of pregnancy: Secondary | ICD-10-CM | POA: Diagnosis not present

## 2023-06-01 DIAGNOSIS — Z363 Encounter for antenatal screening for malformations: Secondary | ICD-10-CM | POA: Diagnosis not present

## 2023-06-01 DIAGNOSIS — Z3183 Encounter for assisted reproductive fertility procedure cycle: Secondary | ICD-10-CM | POA: Diagnosis not present

## 2023-06-25 ENCOUNTER — Other Ambulatory Visit (HOSPITAL_COMMUNITY): Payer: Self-pay

## 2023-06-25 MED ORDER — BUPROPION HCL ER (XL) 150 MG PO TB24
150.0000 mg | ORAL_TABLET | Freq: Every day | ORAL | 4 refills | Status: AC
  Filled 2023-06-25: qty 90, 90d supply, fill #0
  Filled 2023-10-10: qty 90, 90d supply, fill #1
  Filled 2024-02-07: qty 90, 90d supply, fill #2

## 2023-06-30 DIAGNOSIS — Z362 Encounter for other antenatal screening follow-up: Secondary | ICD-10-CM | POA: Diagnosis not present

## 2023-06-30 DIAGNOSIS — Z3A22 22 weeks gestation of pregnancy: Secondary | ICD-10-CM | POA: Diagnosis not present

## 2023-07-26 DIAGNOSIS — Z3482 Encounter for supervision of other normal pregnancy, second trimester: Secondary | ICD-10-CM | POA: Diagnosis not present

## 2023-07-26 DIAGNOSIS — Z3483 Encounter for supervision of other normal pregnancy, third trimester: Secondary | ICD-10-CM | POA: Diagnosis not present

## 2023-08-05 DIAGNOSIS — Z3A27 27 weeks gestation of pregnancy: Secondary | ICD-10-CM | POA: Diagnosis not present

## 2023-08-05 DIAGNOSIS — Z3689 Encounter for other specified antenatal screening: Secondary | ICD-10-CM | POA: Diagnosis not present

## 2023-08-05 DIAGNOSIS — Z23 Encounter for immunization: Secondary | ICD-10-CM | POA: Diagnosis not present

## 2023-08-05 DIAGNOSIS — Z2913 Encounter for prophylactic Rho(D) immune globulin: Secondary | ICD-10-CM | POA: Diagnosis not present

## 2023-08-29 DIAGNOSIS — Z3A31 31 weeks gestation of pregnancy: Secondary | ICD-10-CM | POA: Diagnosis not present

## 2023-08-29 DIAGNOSIS — J029 Acute pharyngitis, unspecified: Secondary | ICD-10-CM | POA: Diagnosis not present

## 2023-08-29 DIAGNOSIS — O99513 Diseases of the respiratory system complicating pregnancy, third trimester: Secondary | ICD-10-CM | POA: Diagnosis not present

## 2023-09-01 ENCOUNTER — Other Ambulatory Visit (HOSPITAL_COMMUNITY): Payer: Self-pay

## 2023-09-01 MED ORDER — CLINDAMYCIN HCL 300 MG PO CAPS
300.0000 mg | ORAL_CAPSULE | Freq: Two times a day (BID) | ORAL | 0 refills | Status: DC
  Filled 2023-09-01: qty 14, 7d supply, fill #0

## 2023-09-08 DIAGNOSIS — Z3A32 32 weeks gestation of pregnancy: Secondary | ICD-10-CM | POA: Diagnosis not present

## 2023-09-09 ENCOUNTER — Inpatient Hospital Stay (HOSPITAL_COMMUNITY)
Admission: AD | Admit: 2023-09-09 | Discharge: 2023-09-10 | Disposition: A | Discharge status: Home / Self Care | Payer: 59 | Attending: Obstetrics and Gynecology | Admitting: Obstetrics and Gynecology

## 2023-09-09 ENCOUNTER — Other Ambulatory Visit: Payer: Self-pay

## 2023-09-09 ENCOUNTER — Encounter (HOSPITAL_COMMUNITY): Payer: Self-pay | Admitting: Obstetrics and Gynecology

## 2023-09-09 DIAGNOSIS — O09523 Supervision of elderly multigravida, third trimester: Secondary | ICD-10-CM | POA: Insufficient documentation

## 2023-09-09 DIAGNOSIS — O4703 False labor before 37 completed weeks of gestation, third trimester: Secondary | ICD-10-CM | POA: Insufficient documentation

## 2023-09-09 DIAGNOSIS — Z3A33 33 weeks gestation of pregnancy: Secondary | ICD-10-CM | POA: Diagnosis not present

## 2023-09-09 DIAGNOSIS — Z3A32 32 weeks gestation of pregnancy: Secondary | ICD-10-CM

## 2023-09-09 DIAGNOSIS — O99891 Other specified diseases and conditions complicating pregnancy: Secondary | ICD-10-CM | POA: Diagnosis not present

## 2023-09-09 LAB — URINALYSIS, ROUTINE W REFLEX MICROSCOPIC
Bilirubin Urine: NEGATIVE
Glucose, UA: 50 mg/dL — AB
Hgb urine dipstick: NEGATIVE
Ketones, ur: NEGATIVE mg/dL
Leukocytes,Ua: NEGATIVE
Nitrite: NEGATIVE
Protein, ur: NEGATIVE mg/dL
Specific Gravity, Urine: 1.006 (ref 1.005–1.030)
pH: 6 (ref 5.0–8.0)

## 2023-09-09 LAB — FETAL FIBRONECTIN: Fetal Fibronectin: NEGATIVE

## 2023-09-09 MED ORDER — BETAMETHASONE SOD PHOS & ACET 6 (3-3) MG/ML IJ SUSP
12.0000 mg | Freq: Once | INTRAMUSCULAR | Status: AC
  Administered 2023-09-09: 12 mg via INTRAMUSCULAR
  Filled 2023-09-09: qty 5

## 2023-09-09 MED ORDER — NIFEDIPINE 10 MG PO CAPS
10.0000 mg | ORAL_CAPSULE | ORAL | Status: AC | PRN
  Administered 2023-09-09 (×4): 10 mg via ORAL
  Filled 2023-09-09 (×4): qty 1

## 2023-09-09 NOTE — MAU Provider Note (Signed)
Chief Complaint:  Abdominal Pain   Event Date/Time   First Provider Initiated Contact with Patient 09/09/23 2049     HPI: Debra Craig is a 37 y.o. G2P1001 at 55w6dwho presents to maternity admissions reporting uterine cramping all day   Was 1cm in office, sent home on bedrest but came back when UCs persisted. . She reports good fetal movement, denies LOF, vaginal bleeding, urinary symptoms, h/a, dizziness, n/v, diarrhea, constipation or fever/chills.   Abdominal Pain This is a new problem. The current episode started today. The problem has been unchanged. The quality of the pain is cramping. Pertinent negatives include no constipation, diarrhea or dysuria. Nothing aggravates the pain. The pain is relieved by Nothing. Treatments tried: rest. The treatment provided no relief.   RN Note: Debra Craig is a 37 y.o. at [redacted]w[redacted]d here in MAU reporting: yesterday was having bad cramps all day, like period cramps. Callled OB, they put her on the monitor, was contracting, was 1cm dilated. Sent her home on bedrest. Any time she gets up, she starts contracting again, so dr told her to come in and get monitored. No bleeding or leaking. Denies recent intercourse. Reports +FM.  Onset of complaint: yesterday Pain score: 2  Past Medical History: Past Medical History:  Diagnosis Date   Acne    Anxiety    Elevated LDL cholesterol level    Liver hemangioma    Muscle twitching    Vitamin D deficiency     Past obstetric history: OB History  Gravida Para Term Preterm AB Living  2 1 1     1   SAB IAB Ectopic Multiple Live Births        0 1    # Outcome Date GA Lbr Len/2nd Weight Sex Type Anes PTL Lv  2 Current           1 Term 11/08/19 [redacted]w[redacted]d 04:55 / 00:28 3030 g F Vag-Spont EPI  LIV    Past Surgical History: Past Surgical History:  Procedure Laterality Date   IUI     KNEE ARTHROSCOPY WITH EXCISION PLICA Right 09/05/2021   Procedure: KNEE ARTHROSCOPY WITH EXCISION PLICA;  Surgeon: Bjorn Pippin, MD;   Location: [facility];  Service: Orthopedics;  Laterality: Right;   vaginal delievery     WISDOM TOOTH EXTRACTION      Family History: History reviewed. No pertinent family history.  Social History: Social History   Tobacco Use   Smoking status: Never   Smokeless tobacco: Never  Vaping Use   Vaping status: Never Used  Substance Use Topics   Alcohol use: Not Currently    Comment: Social Drinking   Drug use: No    Allergies:  Allergies  Allergen Reactions   Penicillins     Did it involve swelling of the face/tongue/throat, SOB, or low BP? No Did it involve sudden or severe rash/hives, skin peeling, or any reaction on the inside of your mouth or nose? no Did you need to seek medical attention at a hospital or doctor's office? No  When did it last happen?       If all above answers are "NO", may proceed with cephalosporin use.    Meds:  Medications Prior to Admission  Medication Sig Dispense Refill Last Dose   buPROPion (WELLBUTRIN XL) 150 MG 24 hr tablet Take 1 tablet (150 mg total) by mouth daily. 90 tablet 4 09/09/2023   clindamycin (CLEOCIN) 300 MG capsule Take 1 capsule (300 mg total) by  mouth 2 (two) times daily for 7 days. 14 capsule 0 09/08/2023   sertraline (ZOLOFT) 50 MG tablet Take 2 tablets (100 mg total) by mouth daily. 180 tablet 3 09/08/2023   Butalbital-APAP-Caffeine (FIORICET) 50-300-40 MG CAPS Take 1 capsule by mouth every 6 (six) hours as needed for 5 days 20 capsule 1 Unknown   estradiol (VIVELLE-DOT) 0.1 MG/24HR patch Place 2 patches (0.2 mg total) onto the skin every 3 (three) days as directed (Patient not taking: Reported on 07/18/2022) 16 patch 4    estradiol (VIVELLE-DOT) 0.1 MG/24HR patch Place 1 patch (0.1 mg total) onto the skin every 3 (three) days. 8 patch 3    Insulin Syringe-Needle U-100 31G X 1/4" 1 ML MISC To use with Lupron 30 each 1    medroxyPROGESTERone (PROVERA) 10 MG tablet Take 1 tablet (10 mg total) by mouth daily for 10 days  10 tablet 2    progesterone (PROMETRIUM) 200 MG capsule Take 1 capsule (200 mg total) by mouth at bedtime. 14 capsule 0    progesterone 50 MG/ML injection Inject 1 mL (50 mg total) into the muscle daily as directed 30 mL 4    sertraline (ZOLOFT) 50 MG tablet 50 mg. Take 3 tablets daily (Patient not taking: Reported on 07/18/2022)      SYRINGE-NEEDLE, DISP, 3 ML (B-D 3CC LUER-LOK SYR 18GX1-1/2) 18G X 1-1/2" 3 ML MISC Use to draw out Progesterone 30 each 3     I have reviewed patient's Past Medical Hx, Surgical Hx, Family Hx, Social Hx, medications and allergies.   ROS:  Review of Systems  Gastrointestinal:  Positive for abdominal pain. Negative for constipation and diarrhea.  Genitourinary:  Negative for dysuria.   Other systems negative  Physical Exam  Patient Vitals for the past 24 hrs:  BP Temp Temp src Pulse Resp SpO2 Height Weight  09/09/23 1855 113/69 98.4 F (36.9 C) Oral 83 16 100 % 5\' 6"  (1.676 m) 78.1 kg   Constitutional: Well-developed, well-nourished female in no acute distress.  Cardiovascular: normal rate  Respiratory: normal effort GI: Abd soft, non-tender, gravid appropriate for gestational age.   No rebound or guarding. MS: Extremities nontender, no edema, normal ROM Neurologic: Alert and oriented x 4.  GU: Neg CVAT.  PELVIC EXAM:  Dilation: Fingertip Effacement (%): 50 Cervical Position: Posterior Station: Ballotable Presentation: Undeterminable Exam by:: Wynelle Bourgeois, CNM   FHT:  Baseline 140 , moderate variability, accelerations present, no decelerations Contractions: q 4-6 mins Irregular    Labs: Results for orders placed or performed during the hospital encounter of 09/09/23 (from the past 24 hour(s))  Urinalysis, Routine w reflex microscopic -Urine, Clean Catch     Status: Abnormal   Collection Time: 09/09/23  7:08 PM  Result Value Ref Range   Color, Urine STRAW (A) YELLOW   APPearance CLEAR CLEAR   Specific Gravity, Urine 1.006 1.005 - 1.030    pH 6.0 5.0 - 8.0   Glucose, UA 50 (A) NEGATIVE mg/dL   Hgb urine dipstick NEGATIVE NEGATIVE   Bilirubin Urine NEGATIVE NEGATIVE   Ketones, ur NEGATIVE NEGATIVE mg/dL   Protein, ur NEGATIVE NEGATIVE mg/dL   Nitrite NEGATIVE NEGATIVE   Leukocytes,Ua NEGATIVE NEGATIVE  Fetal fibronectin     Status: None   Collection Time: 09/09/23  9:07 PM  Result Value Ref Range   Fetal Fibronectin NEGATIVE NEGATIVE     Imaging:  No results found.  MAU Course/MDM: I have reviewed the triage vital signs and the nursing notes.  Pertinent labs & imaging results that were available during my care of the patient were reviewed by me and considered in my medical decision making (see chart for details).      I have reviewed her medical records including past results, notes and treatments.   I have ordered labs and reviewed results. UA is clear and FFn is negative  Discussed this is reassuring for not likely to deliver in the next 2 weeks NST reviewed Consult Dr Macon Large with presentation, exam findings and test results  Recommends Betamethasone series Treatments in MAU included Procardia series x 4 doses and Betamethasone #1    Assessment: [place] IUP at [redacted]w[redacted]d Preterm uterine contractions Threatened preterm labor  Plan: Discharge home Preterm Labor precautions and fetal kick counts Return tomorrow for second BMZ dose Follow up in Office for prenatal visits and recheck Encouraged to return if she develops worsening of symptoms, increase in pain, fever, or other concerning symptoms.   Pt stable at time of discharge.  Wynelle Bourgeois CNM, MSN Certified Nurse-Midwife 09/09/2023 8:49 PM

## 2023-09-09 NOTE — MAU Note (Signed)
Debra Craig is a 37 y.o. at [redacted]w[redacted]d here in MAU reporting: yesterday was having bad cramps all day, like period cramps. Callled OB, they put her on the monitor, was contracting, was 1cm dilated. Sent her home on bedrest. Any time she gets up, she starts contracting again, so dr told her to come in and get monitored. No bleeding or leaking. Denies recent intercourse. Reports +FM.  Onset of complaint: yesterday Pain score: 2 Vitals:   09/09/23 1855  BP: 113/69  Pulse: 83  Resp: 16  Temp: 98.4 F (36.9 C)  SpO2: 100%     FHT:144 Lab orders placed from triage:  urine

## 2023-09-10 DIAGNOSIS — O4703 False labor before 37 completed weeks of gestation, third trimester: Secondary | ICD-10-CM

## 2023-09-10 DIAGNOSIS — Z3A32 32 weeks gestation of pregnancy: Secondary | ICD-10-CM

## 2023-09-11 ENCOUNTER — Other Ambulatory Visit (HOSPITAL_COMMUNITY): Payer: Self-pay

## 2023-09-11 ENCOUNTER — Other Ambulatory Visit: Payer: Self-pay

## 2023-09-11 ENCOUNTER — Inpatient Hospital Stay (HOSPITAL_COMMUNITY)
Admission: AD | Admit: 2023-09-11 | Discharge: 2023-09-11 | Disposition: A | Discharge status: Home / Self Care | Payer: 59 | Attending: Obstetrics and Gynecology | Admitting: Obstetrics and Gynecology

## 2023-09-11 DIAGNOSIS — Z3A33 33 weeks gestation of pregnancy: Secondary | ICD-10-CM | POA: Diagnosis not present

## 2023-09-11 MED ORDER — NIFEDIPINE 10 MG PO CAPS
10.0000 mg | ORAL_CAPSULE | Freq: Three times a day (TID) | ORAL | 0 refills | Status: DC
  Filled 2023-09-11: qty 30, 10d supply, fill #0

## 2023-09-11 MED ORDER — BETAMETHASONE SOD PHOS & ACET 6 (3-3) MG/ML IJ SUSP
12.0000 mg | Freq: Once | INTRAMUSCULAR | Status: AC
  Administered 2023-09-11: 12 mg via INTRAMUSCULAR

## 2023-09-11 NOTE — MAU Provider Note (Signed)
  S [name] is a 37 y.o. G2P1001 patient who presents to MAU today for scheduled second betamethasone injections. Was seen yesterday for preterm labor concerns, but ultimately discharged home. No concerns or complaints today. No contractions.   O BP 109/62 (BP Location: Left Arm)   Pulse 72   Temp 98.2 F (36.8 C) (Oral)   Resp 18   SpO2 99%  Physical Exam  A Medical screening exam complete [redacted] weeks gestation   P Discharge from MAU in stable condition Patient given return precautions Warning signs for worsening condition that would warrant emergency follow-up discussed Patient may return to MAU as needed   Wyn Forster, MD 09/11/2023 6:57 PM

## 2023-09-11 NOTE — MAU Note (Signed)
Debra Craig is a 37 y.o. at [redacted]w[redacted]d here in MAU reporting: she's here to receive 2nd Betamethasone injection.  Denies VB or LOF.  Reports +FM. LMP: NA Onset of complaint: today Pain score: 0 Vitals:   09/11/23 1828  BP: 109/62  Pulse: 72  Resp: 18  Temp: 98.2 F (36.8 C)  SpO2: 99%     FHT:129 bpm Lab orders placed from triage:   BMZ

## 2023-09-14 ENCOUNTER — Other Ambulatory Visit: Payer: Self-pay

## 2023-09-14 ENCOUNTER — Other Ambulatory Visit (HOSPITAL_COMMUNITY): Payer: Self-pay

## 2023-09-15 ENCOUNTER — Other Ambulatory Visit (HOSPITAL_COMMUNITY): Payer: Self-pay

## 2023-09-15 ENCOUNTER — Other Ambulatory Visit: Payer: Self-pay

## 2023-09-28 ENCOUNTER — Other Ambulatory Visit (HOSPITAL_COMMUNITY): Payer: Self-pay

## 2023-10-06 DIAGNOSIS — Z3A36 36 weeks gestation of pregnancy: Secondary | ICD-10-CM | POA: Diagnosis not present

## 2023-10-06 DIAGNOSIS — Z3685 Encounter for antenatal screening for Streptococcus B: Secondary | ICD-10-CM | POA: Diagnosis not present

## 2023-10-06 DIAGNOSIS — O09819 Supervision of pregnancy resulting from assisted reproductive technology, unspecified trimester: Secondary | ICD-10-CM | POA: Diagnosis not present

## 2023-10-15 DIAGNOSIS — O09819 Supervision of pregnancy resulting from assisted reproductive technology, unspecified trimester: Secondary | ICD-10-CM | POA: Diagnosis not present

## 2023-10-15 DIAGNOSIS — Z3A38 38 weeks gestation of pregnancy: Secondary | ICD-10-CM | POA: Diagnosis not present

## 2023-10-19 ENCOUNTER — Encounter (HOSPITAL_COMMUNITY): Payer: Self-pay | Admitting: Obstetrics and Gynecology

## 2023-10-19 ENCOUNTER — Inpatient Hospital Stay (HOSPITAL_COMMUNITY): Payer: 59 | Admitting: Anesthesiology

## 2023-10-19 ENCOUNTER — Inpatient Hospital Stay (HOSPITAL_COMMUNITY)
Admission: AD | Admit: 2023-10-19 | Discharge: 2023-10-21 | DRG: 807 | Disposition: A | Discharge status: Home / Self Care | Payer: 59 | Attending: Obstetrics and Gynecology | Admitting: Obstetrics and Gynecology

## 2023-10-19 ENCOUNTER — Other Ambulatory Visit: Payer: Self-pay

## 2023-10-19 DIAGNOSIS — O368130 Decreased fetal movements, third trimester, not applicable or unspecified: Principal | ICD-10-CM | POA: Diagnosis present

## 2023-10-19 DIAGNOSIS — O134 Gestational [pregnancy-induced] hypertension without significant proteinuria, complicating childbirth: Secondary | ICD-10-CM | POA: Diagnosis present

## 2023-10-19 DIAGNOSIS — F32A Depression, unspecified: Secondary | ICD-10-CM | POA: Diagnosis present

## 2023-10-19 DIAGNOSIS — Z3A38 38 weeks gestation of pregnancy: Principal | ICD-10-CM

## 2023-10-19 DIAGNOSIS — F419 Anxiety disorder, unspecified: Secondary | ICD-10-CM | POA: Diagnosis not present

## 2023-10-19 DIAGNOSIS — O99344 Other mental disorders complicating childbirth: Secondary | ICD-10-CM | POA: Diagnosis present

## 2023-10-19 DIAGNOSIS — O43893 Other placental disorders, third trimester: Secondary | ICD-10-CM | POA: Diagnosis not present

## 2023-10-19 DIAGNOSIS — Z3A00 Weeks of gestation of pregnancy not specified: Secondary | ICD-10-CM | POA: Diagnosis not present

## 2023-10-19 LAB — CBC
HCT: 37.7 % (ref 36.0–46.0)
Hemoglobin: 13.1 g/dL (ref 12.0–15.0)
MCH: 32 pg (ref 26.0–34.0)
MCHC: 34.7 g/dL (ref 30.0–36.0)
MCV: 92.2 fL (ref 80.0–100.0)
Platelets: 277 K/uL (ref 150–400)
RBC: 4.09 MIL/uL (ref 3.87–5.11)
RDW: 12.1 % (ref 11.5–15.5)
WBC: 10.8 K/uL — ABNORMAL HIGH (ref 4.0–10.5)
nRBC: 0 % (ref 0.0–0.2)

## 2023-10-19 LAB — COMPREHENSIVE METABOLIC PANEL
ALT: 11 U/L (ref 0–44)
AST: 18 U/L (ref 15–41)
Albumin: 2.6 g/dL — ABNORMAL LOW (ref 3.5–5.0)
Alkaline Phosphatase: 147 U/L — ABNORMAL HIGH (ref 38–126)
Anion gap: 11 (ref 5–15)
BUN: 7 mg/dL (ref 6–20)
CO2: 20 mmol/L — ABNORMAL LOW (ref 22–32)
Calcium: 8.5 mg/dL — ABNORMAL LOW (ref 8.9–10.3)
Chloride: 104 mmol/L (ref 98–111)
Creatinine, Ser: 1.01 mg/dL — ABNORMAL HIGH (ref 0.44–1.00)
GFR, Estimated: >60 mL/min (ref >60)
Glucose, Bld: 78 mg/dL (ref 70–99)
Potassium: 3.5 mmol/L (ref 3.5–5.1)
Sodium: 135 mmol/L (ref 135–145)
Total Bilirubin: 0.5 mg/dL (ref <1.2)
Total Protein: 5.9 g/dL — ABNORMAL LOW (ref 6.5–8.1)

## 2023-10-19 LAB — TYPE AND SCREEN
ABO/RH(D): A POS
Antibody Screen: NEGATIVE

## 2023-10-19 LAB — PROTEIN / CREATININE RATIO, URINE
Creatinine, Urine: 40 mg/dL
Total Protein, Urine: <6 mg/dL

## 2023-10-19 MED ORDER — DIBUCAINE (PERIANAL) 1 % EX OINT: 1.0000 | TOPICAL_OINTMENT | CUTANEOUS | Status: DC | PRN

## 2023-10-19 MED ORDER — SERTRALINE HCL 100 MG PO TABS
100.0000 mg | ORAL_TABLET | Freq: Every day | ORAL | Status: DC
  Filled 2023-10-19: qty 1

## 2023-10-19 MED ORDER — ACETAMINOPHEN 325 MG PO TABS: 650.0000 mg | ORAL_TABLET | ORAL | Status: DC | PRN

## 2023-10-19 MED ORDER — SOD CITRATE-CITRIC ACID 500-334 MG/5ML PO SOLN: 30.0000 mL | ORAL | Status: DC | PRN

## 2023-10-19 MED ORDER — PRENATAL MULTIVITAMIN CH
1.0000 | ORAL_TABLET | Freq: Every day | ORAL | Status: DC
  Administered 2023-10-20 – 2023-10-21 (×2): 1 via ORAL
  Filled 2023-10-19 (×2): qty 1

## 2023-10-19 MED ORDER — TRANEXAMIC ACID-NACL 1000-0.7 MG/100ML-% IV SOLN: 1000.0000 mg | Freq: Once | INTRAVENOUS | Status: DC

## 2023-10-19 MED ORDER — DIPHENHYDRAMINE HCL 25 MG PO CAPS: 25.0000 mg | ORAL_CAPSULE | Freq: Four times a day (QID) | ORAL | Status: DC | PRN

## 2023-10-19 MED ORDER — PHENYLEPHRINE 80 MCG/ML (10ML) SYRINGE FOR IV PUSH (FOR BLOOD PRESSURE SUPPORT)
80.0000 ug | PREFILLED_SYRINGE | INTRAVENOUS | Status: DC | PRN
  Filled 2023-10-19: qty 10

## 2023-10-19 MED ORDER — LIDOCAINE HCL (PF) 1 % IJ SOLN
INTRAMUSCULAR | Status: DC | PRN
  Administered 2023-10-19: 2 mL via EPIDURAL
  Administered 2023-10-19: 3 mL via EPIDURAL
  Administered 2023-10-19: 5 mL via EPIDURAL

## 2023-10-19 MED ORDER — TRANEXAMIC ACID-NACL 1000-0.7 MG/100ML-% IV SOLN
INTRAVENOUS | Status: AC
  Filled 2023-10-19: qty 100

## 2023-10-19 MED ORDER — ONDANSETRON HCL 4 MG/2ML IJ SOLN: 4.0000 mg | INTRAMUSCULAR | Status: DC | PRN

## 2023-10-19 MED ORDER — COCONUT OIL OIL: 1.0000 | TOPICAL_OIL | Status: DC | PRN

## 2023-10-19 MED ORDER — EPHEDRINE 5 MG/ML INJ: 10.0000 mg | INTRAVENOUS | Status: DC | PRN

## 2023-10-19 MED ORDER — LIDOCAINE HCL (PF) 1 % IJ SOLN: 30.0000 mL | INTRAMUSCULAR | Status: DC | PRN

## 2023-10-19 MED ORDER — ONDANSETRON HCL 4 MG/2ML IJ SOLN: 4.0000 mg | Freq: Four times a day (QID) | INTRAMUSCULAR | Status: DC | PRN

## 2023-10-19 MED ORDER — OXYTOCIN-SODIUM CHLORIDE 30-0.9 UT/500ML-% IV SOLN: 2.5000 [IU]/h | INTRAVENOUS | Status: DC

## 2023-10-19 MED ORDER — OXYCODONE-ACETAMINOPHEN 5-325 MG PO TABS: 1.0000 | ORAL_TABLET | ORAL | Status: DC | PRN

## 2023-10-19 MED ORDER — FENTANYL-BUPIVACAINE-NACL 0.5-0.125-0.9 MG/250ML-% EP SOLN
12.0000 mL/h | EPIDURAL | Status: DC | PRN
  Administered 2023-10-19: 12 mL/h via EPIDURAL
  Filled 2023-10-19: qty 250

## 2023-10-19 MED ORDER — OXYCODONE-ACETAMINOPHEN 5-325 MG PO TABS: 2.0000 | ORAL_TABLET | ORAL | Status: DC | PRN

## 2023-10-19 MED ORDER — FENTANYL CITRATE (PF) 100 MCG/2ML IJ SOLN: 50.0000 ug | INTRAMUSCULAR | Status: DC | PRN

## 2023-10-19 MED ORDER — PHENYLEPHRINE 80 MCG/ML (10ML) SYRINGE FOR IV PUSH (FOR BLOOD PRESSURE SUPPORT): 80.0000 ug | PREFILLED_SYRINGE | INTRAVENOUS | Status: DC | PRN

## 2023-10-19 MED ORDER — TETANUS-DIPHTH-ACELL PERTUSSIS 5-2.5-18.5 LF-MCG/0.5 IM SUSY: 0.5000 mL | PREFILLED_SYRINGE | Freq: Once | INTRAMUSCULAR | Status: DC

## 2023-10-19 MED ORDER — ZOLPIDEM TARTRATE 5 MG PO TABS: 5.0000 mg | ORAL_TABLET | Freq: Every evening | ORAL | Status: DC | PRN

## 2023-10-19 MED ORDER — DIPHENHYDRAMINE HCL 50 MG/ML IJ SOLN: 12.5000 mg | INTRAMUSCULAR | Status: DC | PRN

## 2023-10-19 MED ORDER — LACTATED RINGERS IV SOLN: 500.0000 mL | INTRAVENOUS | Status: DC | PRN

## 2023-10-19 MED ORDER — LACTATED RINGERS IV SOLN: INTRAVENOUS | Status: DC

## 2023-10-19 MED ORDER — TERBUTALINE SULFATE 1 MG/ML IJ SOLN: 0.2500 mg | Freq: Once | INTRAMUSCULAR | Status: DC | PRN

## 2023-10-19 MED ORDER — WITCH HAZEL-GLYCERIN EX PADS: 1.0000 | MEDICATED_PAD | CUTANEOUS | Status: DC | PRN

## 2023-10-19 MED ORDER — BUPROPION HCL ER (XL) 150 MG PO TB24
150.0000 mg | ORAL_TABLET | Freq: Every day | ORAL | Status: DC
  Administered 2023-10-20: 150 mg via ORAL
  Filled 2023-10-19 (×2): qty 1

## 2023-10-19 MED ORDER — SENNOSIDES-DOCUSATE SODIUM 8.6-50 MG PO TABS
2.0000 | ORAL_TABLET | Freq: Every day | ORAL | Status: DC
  Administered 2023-10-20 – 2023-10-21 (×2): 2 via ORAL
  Filled 2023-10-19 (×2): qty 2

## 2023-10-19 MED ORDER — ONDANSETRON HCL 4 MG PO TABS: 4.0000 mg | ORAL_TABLET | ORAL | Status: DC | PRN

## 2023-10-19 MED ORDER — OXYTOCIN-SODIUM CHLORIDE 30-0.9 UT/500ML-% IV SOLN
1.0000 m[IU]/min | INTRAVENOUS | Status: DC
Start: 2023-10-19 — End: 2023-10-19
  Administered 2023-10-19: 2 m[IU]/min via INTRAVENOUS
  Filled 2023-10-19: qty 500

## 2023-10-19 MED ORDER — LACTATED RINGERS IV SOLN: 500.0000 mL | Freq: Once | INTRAVENOUS | Status: DC

## 2023-10-19 MED ORDER — IBUPROFEN 600 MG PO TABS
600.0000 mg | ORAL_TABLET | Freq: Four times a day (QID) | ORAL | Status: DC
  Administered 2023-10-20 – 2023-10-21 (×6): 600 mg via ORAL
  Filled 2023-10-19 (×7): qty 1

## 2023-10-19 MED ORDER — LACTATED RINGERS IV SOLN
500.0000 mL | Freq: Once | INTRAVENOUS | Status: AC
  Administered 2023-10-19: 500 mL via INTRAVENOUS

## 2023-10-19 MED ORDER — SIMETHICONE 80 MG PO CHEW: 80.0000 mg | CHEWABLE_TABLET | ORAL | Status: DC | PRN

## 2023-10-19 MED ORDER — OXYTOCIN BOLUS FROM INFUSION
333.0000 mL | Freq: Once | INTRAVENOUS | Status: AC
  Administered 2023-10-19: 333 mL via INTRAVENOUS

## 2023-10-19 MED ORDER — BENZOCAINE-MENTHOL 20-0.5 % EX AERO: 1.0000 | INHALATION_SPRAY | CUTANEOUS | Status: DC | PRN

## 2023-10-19 NOTE — MAU Note (Signed)
Debra Craig is a 37 y.o. at [redacted]w[redacted]d here in MAU reporting: she;s having both abdominal cramping and pelvic pressure that began this morning.  Reports cramping is intermittent. Denies VB or LOF.  Endorses +FM. LMP: NA Onset of complaint: today Pain score: 3 Vitals:   10/19/23 1155  BP: 127/71  Pulse: 82  Resp: 18  Temp: 98.4 F (36.9 C)  SpO2: 98%     FHT:144 bpm Lab orders placed from triage:   None

## 2023-10-19 NOTE — Progress Notes (Signed)
Intermittent rectal pressure, CE now 7/90/-1.  BP 131/83   Pulse 65   Temp 99.5 F (37.5 C) (Axillary)   Resp 16   Ht 5\' 6"  (1.676 m)   Wt 80.8 kg   SpO2 99%   BMI 28.75 kg/m  Pitocin at 46mU/min, TOCO q2-80m  Anticipate SVD

## 2023-10-19 NOTE — Progress Notes (Addendum)
Comfortable s/p epidural. Denies PreE symptoms BP 130/82   Pulse 72   Temp 99.5 F (37.5 C) (Axillary)   Resp 16   Ht 5\' 6"  (1.676 m)   Wt 80.8 kg   SpO2 99%   BMI 28.75 kg/m  Clear AROM @ 1623, CE 3-4/80/-1  Recheck approx 1800, augment as needed. Of note, intermittently elevated BP with several DBPs in 90s, one-time 141/79 @ 1616. CMP, uPC ordered. Close eye on pressures  ADDENDUM 1830: CE 4/90/-1, pitocin ordered 2x2. Still intermittent BP elevations. Cr returned at 1.01, AST/ALT 18/11, uPC WNL, 13.1/37.7/277K. Continue to monitor, anticipate SVD

## 2023-10-19 NOTE — H&P (Signed)
Debra Craig is a 37 y.o. female presenting for evaluation of contractions and decreased FM since this AM. CTX q5-18m and getting more painful. Denies VB, LOF but did lose mucus plug this AM  PNC c/b 1) Threatened PTL - completed BMTZ 10/9-10 2) AMA - NIPT WNL 3) IVF - donor egg and sperm, NST since 36wks 4) Anxiety/depression - controlled on zoloft 100mg  and wellbutrin 150mg  every day  GBS neg OB History     Gravida  2   Para  1   Term  1   Preterm      AB      Living  1      SAB      IAB      Ectopic      Multiple  0   Live Births  1          Past Medical History:  Diagnosis Date   Acne    Anxiety    Elevated LDL cholesterol level    Liver hemangioma    Muscle twitching    Vitamin D deficiency    Past Surgical History:  Procedure Laterality Date   IUI     KNEE ARTHROSCOPY WITH EXCISION PLICA Right 09/05/2021   Procedure: KNEE ARTHROSCOPY WITH EXCISION PLICA;  Surgeon: Bjorn Pippin, MD;  Location: [facility];  Service: Orthopedics;  Laterality: Right;   vaginal delievery     WISDOM TOOTH EXTRACTION     Family History: family history includes Healthy in her father and mother. Social History:  reports that she has never smoked. She has never used smokeless tobacco. She reports that she does not currently use alcohol. She reports that she does not use drugs.     Maternal Diabetes: No1hr 112 Genetic Screening: Normal Maternal Ultrasounds/Referrals: Normal Fetal Ultrasounds or other Referrals:  Fetal echo Maternal Substance Abuse:  No Significant Maternal Medications:  None Significant Maternal Lab Results:  Group B Strep negative Number of Prenatal Visits:greater than 3 verified prenatal visits Maternal Vaccinations:TDap and Flu Other Comments:  None  Review of Systems  Constitutional:  Negative for chills and fever.  Respiratory:  Negative for shortness of breath.   Cardiovascular:  Negative for chest pain, palpitations and leg  swelling.  Gastrointestinal:  Positive for abdominal pain (w/ ctx). Negative for nausea and vomiting.  Neurological:  Negative for dizziness, weakness and headaches.  Psychiatric/Behavioral:  Negative for suicidal ideas.    Maternal Medical History:  Reason for admission: Contractions.  Nausea.  Contractions: Onset was yesterday.   Frequency: regular.   Perceived severity is moderate.   Fetal activity: Perceived fetal activity is decreased.   Last perceived fetal movement was within the past 12 hours.   Prenatal complications: No PIH or placental abnormality.   Prenatal Complications - Diabetes: none.   Dilation: 3 Effacement (%): 70 Station: -2 Exam by:: Raliyah Montella Blood pressure 134/80, pulse 77, temperature 98.4 F (36.9 C), temperature source Oral, resp. rate 18, height 5\' 6"  (1.676 m), weight 80.8 kg, SpO2 98%. Exam Physical Exam Constitutional:      General: She is not in acute distress.    Appearance: She is well-developed.  HENT:     Head: Normocephalic and atraumatic.  Eyes:     Pupils: Pupils are equal, round, and reactive to light.  Cardiovascular:     Rate and Rhythm: Normal rate and regular rhythm.     Heart sounds: No murmur heard.    No gallop.  Abdominal:  Tenderness: There is no abdominal tenderness. There is no guarding or rebound.  Genitourinary:    Vagina: Normal.     Uterus: Normal.   Musculoskeletal:        General: Normal range of motion.     Cervical back: Normal range of motion and neck supple.  Skin:    General: Skin is warm and dry.  Neurological:     Mental Status: She is alert and oriented to person, place, and time.     Prenatal labs: ABO, Rh:  Apos Antibody:  neg Rubella:  imm RPR:   nr HBsAg:   neg HIV:   nr GBS:   NEG  Cat 1 tracing, TOCO q79m CE 1-2/50/-2 --> 3/70/-2  Assessment/Plan: This is a 36yo G2P1001 @ 38 4/7 by DOT (IVF) presents in early labor and perceived decreased FM. Cat 1 tracing. Given AMA and IVF, will  admit in early labor. Desires epidural, AROM as needed for augmentation. GBS neg. Pelvis proven to 6lb10oz  Valerie Roys Lorenza Shakir 10/19/2023, 2:09 PM

## 2023-10-19 NOTE — Progress Notes (Signed)
   10/19/23 2000  Spiritual Encounters  Type of Visit Initial  Care provided to: Pt not available  Referral source Code page  Reason for visit Code  OnCall Visit No   Chaplain responded to a neonatal code blue. On arrival the concern was resolved. Patients were attended to by the medical team. Chaplain not needed.   Valerie Roys [facility]  [facility]  [phone]

## 2023-10-19 NOTE — Anesthesia Procedure Notes (Signed)
Epidural Patient location during procedure: OB Start time: 10/19/2023 3:36 PM End time: 10/19/2023 3:43 PM  Staffing Anesthesiologist: Collene Schlichter, MD Performed: anesthesiologist   Preanesthetic Checklist Completed: patient identified, IV checked, risks and benefits discussed, monitors and equipment checked, pre-op evaluation and timeout performed  Epidural Patient position: sitting Prep: DuraPrep Patient monitoring: blood pressure and continuous pulse ox Approach: midline Location: L3-L4 Injection technique: LOR air  Needle:  Needle type: Tuohy  Needle gauge: 17 G Needle length: 9 cm Needle insertion depth: 5 cm Catheter size: 19 Gauge Catheter at skin depth: 10 cm Test dose: negative and Other (1% Lidocaine)  Additional Notes Patient identified.  Risk benefits discussed including failed block, incomplete pain control, headache, nerve damage, paralysis, blood pressure changes, nausea, vomiting, reactions to medication both toxic or allergic, and postpartum back pain.  Patient expressed understanding and wished to proceed.  All questions were answered.  Sterile technique used throughout procedure and epidural site dressed with sterile barrier dressing. No paresthesia or other complications noted. The patient did not experience any signs of intravascular injection such as tinnitus or metallic taste in mouth nor signs of intrathecal spread such as rapid motor block. Please see nursing notes for vital signs. Reason for block:procedure for pain

## 2023-10-19 NOTE — Anesthesia Preprocedure Evaluation (Signed)
Anesthesia Evaluation  Patient identified by MRN, date of birth, ID band Patient awake    Reviewed: Allergy & Precautions, NPO status , Patient's Chart, lab work & pertinent test results  Airway Mallampati: II  TM Distance: >3 FB Neck ROM: Full    Dental  (+) Teeth Intact, Dental Advisory Given   Pulmonary neg pulmonary ROS   Pulmonary exam normal breath sounds clear to auscultation       Cardiovascular negative cardio ROS Normal cardiovascular exam Rhythm:Regular Rate:Normal     Neuro/Psych  PSYCHIATRIC DISORDERS Anxiety     negative neurological ROS     GI/Hepatic negative GI ROS, Neg liver ROS,,,  Endo/Other  negative endocrine ROS    Renal/GU negative Renal ROS     Musculoskeletal negative musculoskeletal ROS (+)    Abdominal   Peds  Hematology negative hematology ROS (+) Plt 277k   Anesthesia Other Findings Day of surgery medications reviewed with the patient.  Reproductive/Obstetrics (+) Pregnancy                             Anesthesia Physical Anesthesia Plan  ASA: 2  Anesthesia Plan: Epidural   Post-op Pain Management:    Induction:   PONV Risk Score and Plan: 2 and Treatment may vary due to age or medical condition  Airway Management Planned: Natural Airway  Additional Equipment:   Intra-op Plan:   Post-operative Plan:   Informed Consent: I have reviewed the patients History and Physical, chart, labs and discussed the procedure including the risks, benefits and alternatives for the proposed anesthesia with the patient or authorized representative who has indicated his/her understanding and acceptance.     Dental advisory given  Plan Discussed with:   Anesthesia Plan Comments: (Patient identified. Risks/Benefits/Options discussed with patient including but not limited to bleeding, infection, nerve damage, paralysis, failed block, incomplete pain control,  headache, blood pressure changes, nausea, vomiting, reactions to medication both or allergic, itching and postpartum back pain. Confirmed with bedside nurse the patient's most recent platelet count. Confirmed with patient that they are not currently taking any anticoagulation, have any bleeding history or any family history of bleeding disorders. Patient expressed understanding and wished to proceed. All questions were answered. )       Anesthesia Quick Evaluation

## 2023-10-20 LAB — CBC
HCT: 33 % — ABNORMAL LOW (ref 36.0–46.0)
Hemoglobin: 11.3 g/dL — ABNORMAL LOW (ref 12.0–15.0)
MCH: 31.4 pg (ref 26.0–34.0)
MCHC: 34.2 g/dL (ref 30.0–36.0)
MCV: 91.7 fL (ref 80.0–100.0)
Platelets: 220 10*3/uL (ref 150–400)
RBC: 3.6 MIL/uL — ABNORMAL LOW (ref 3.87–5.11)
RDW: 12 % (ref 11.5–15.5)
WBC: 13.5 10*3/uL — ABNORMAL HIGH (ref 4.0–10.5)
nRBC: 0 % (ref 0.0–0.2)

## 2023-10-20 LAB — RPR: RPR Ser Ql: NONREACTIVE

## 2023-10-20 MED ORDER — SERTRALINE HCL 100 MG PO TABS
100.0000 mg | ORAL_TABLET | Freq: Every day | ORAL | Status: DC
  Administered 2023-10-20: 100 mg via ORAL
  Filled 2023-10-20: qty 1

## 2023-10-20 NOTE — Anesthesia Postprocedure Evaluation (Signed)
Anesthesia Post Note  Patient: Debra Craig  Procedure(s) Performed: AN AD HOC LABOR EPIDURAL     Patient location during evaluation: Mother Baby Anesthesia Type: Epidural Level of consciousness: awake, oriented and awake and alert Pain management: pain level controlled Vital Signs Assessment: post-procedure vital signs reviewed and stable Respiratory status: spontaneous breathing, respiratory function stable and nonlabored ventilation Cardiovascular status: stable Postop Assessment: no headache, adequate PO intake, able to ambulate, patient able to bend at knees and no apparent nausea or vomiting Anesthetic complications: no   No notable events documented.  Last Vitals:  Vitals:   10/20/23 0628 10/20/23 1106  BP: 130/81 126/80  Pulse: 65 64  Resp: 18 16  Temp: 36.7 C 37 C  SpO2: 100% 98%    Last Pain:  Vitals:   10/20/23 1106  TempSrc: Oral  PainSc: 4    Pain Goal:                   Ann-Marie Kluge

## 2023-10-20 NOTE — Progress Notes (Signed)
Post Partum Day 1 Subjective: no complaints, up ad lib, voiding, tolerating PO, + flatus, and denies HA, VC, CP , SOB, and RUQ pain  Objective:    10/20/2023    6:28 AM 10/20/2023    2:23 AM 10/19/2023   11:15 PM  Vitals with BMI  Systolic 130 122 213  Diastolic 81 62 83  Pulse 65 66 70     Physical Exam:  General: alert, cooperative, no distress Lochia: appropriate Uterine Fundus: firm DVT Evaluation: No evidence of DVT seen on physical exam.  Recent Labs    10/19/23 1436 10/20/23 0611  HGB 13.1 11.3*  HCT 37.7 33.0*    Assessment/Plan: Plan for discharge tomorrow if Bps remain normotensive gHTN: asx, cr mildly elevated at 1.01, other pre-E labs wnl. Repeat on PPD2. Normotensive for most recent 12 hours.   LOS: 1 day   Willa Frater, MD 10/20/2023, 7:49 AM

## 2023-10-20 NOTE — Lactation Note (Signed)
This note was copied from a baby's chart. Lactation Consultation Note  Patient Name: Debra Craig Date: 10/20/2023 Age:37 hours Reason for consult: Initial assessment;Early term 37-38.6wks;Nipple pain/trauma;Breastfeeding assistance;Mother's request (IVF not due to infertility)  P2- MOB requested lactation assistance due to infant causing nipple pain. LC assisted with placing infant on the left breast in the cross cradle position. Infant actively latched to the breast and started nursing immediately. Infant had a strong rhythmic suck. LC noted multiple audible swallows. MOB verbalized pain as infant nursed. LC noted that infant was tucking in her upper lip. LC demonstrated how to flange the lips. MOB verbalized relief. LC encouraged MOB to flange infant's lips every feeding.  LC reviewed feeding infant on cue 8-12x in 24 hrs, not allowing infant to go over 3 hrs without a feeding, CDC milk storage guidelines and LC services handout. LC encouraged MOB to call for further assistance as needed. LC provided MOB with coconut oil to help with her sore nipples.  Maternal Data Has patient been taught Hand Expression?: Yes Does the patient have breastfeeding experience prior to this delivery?: Yes How long did the patient breastfeed?: 11 months  Feeding Mother's Current Feeding Choice: Breast Milk  LATCH Score Latch: Grasps breast easily, tongue down, lips flanged, rhythmical sucking.  Audible Swallowing: Spontaneous and intermittent  Type of Nipple: Everted at rest and after stimulation  Comfort (Breast/Nipple): Soft / non-tender  Hold (Positioning): Assistance needed to correctly position infant at breast and maintain latch.  LATCH Score: 9   Lactation Tools Discussed/Used Tools: Coconut oil Pump Education: Milk Storage  Interventions Interventions: Breast feeding basics reviewed;Assisted with latch;Breast compression;Adjust position;Support pillows;Position  options;Coconut oil;Education;LC Services brochure  Discharge Discharge Education: Warning signs for feeding baby Pump: Hands Free;Personal  Consult Status Consult Status: Follow-up Date: 10/21/23 Follow-up type: In-patient    Dema Severin BS, IBCLC 10/20/2023, 8:50 PM

## 2023-10-20 NOTE — Progress Notes (Signed)
MOB was referred for history of depression/anxiety.  * Referral screened out by Clinical Social Worker because none of the following criteria appear to apply:  ~ History of anxiety/depression during this pregnancy, or of post-partum depression following prior delivery.  ~ Diagnosis of anxiety and/or depression within last 3 years  OR  * MOB's symptoms currently being treated with medication and/or therapy.  Per OB notes, MOB is prescribed Zoloft 100mg  and Wellbutrin 150mg  for support.  Please contact the Clinical Social Worker if needs arise, by [facility] request, or if MOB scores greater than 9/yes to question 10 on Edinburgh Postpartum Depression Screen.  Enos Fling, Theresia Majors Clinical Social Worker [phone]

## 2023-10-21 ENCOUNTER — Other Ambulatory Visit (HOSPITAL_COMMUNITY): Payer: Self-pay

## 2023-10-21 LAB — COMPREHENSIVE METABOLIC PANEL
ALT: 14 U/L (ref 0–44)
AST: 23 U/L (ref 15–41)
Albumin: 2.1 g/dL — ABNORMAL LOW (ref 3.5–5.0)
Alkaline Phosphatase: 111 U/L (ref 38–126)
Anion gap: 8 (ref 5–15)
BUN: 8 mg/dL (ref 6–20)
CO2: 21 mmol/L — ABNORMAL LOW (ref 22–32)
Calcium: 8.1 mg/dL — ABNORMAL LOW (ref 8.9–10.3)
Chloride: 107 mmol/L (ref 98–111)
Creatinine, Ser: 0.98 mg/dL (ref 0.44–1.00)
GFR, Estimated: >60 mL/min (ref >60)
Glucose, Bld: 85 mg/dL (ref 70–99)
Potassium: 4.1 mmol/L (ref 3.5–5.1)
Sodium: 136 mmol/L (ref 135–145)
Total Bilirubin: 0.3 mg/dL (ref <1.2)
Total Protein: 4.9 g/dL — ABNORMAL LOW (ref 6.5–8.1)

## 2023-10-21 LAB — CBC
HCT: 32.6 % — ABNORMAL LOW (ref 36.0–46.0)
Hemoglobin: 11.1 g/dL — ABNORMAL LOW (ref 12.0–15.0)
MCH: 31.1 pg (ref 26.0–34.0)
MCHC: 34 g/dL (ref 30.0–36.0)
MCV: 91.3 fL (ref 80.0–100.0)
Platelets: 227 10*3/uL (ref 150–400)
RBC: 3.57 MIL/uL — ABNORMAL LOW (ref 3.87–5.11)
RDW: 12 % (ref 11.5–15.5)
WBC: 8.7 10*3/uL (ref 4.0–10.5)
nRBC: 0 % (ref 0.0–0.2)

## 2023-10-21 LAB — SURGICAL PATHOLOGY

## 2023-10-21 MED ORDER — IBUPROFEN 600 MG PO TABS
600.0000 mg | ORAL_TABLET | Freq: Four times a day (QID) | ORAL | 1 refills | Status: DC | PRN
  Filled 2023-10-21 – 2023-11-26 (×2): qty 40, 10d supply, fill #0

## 2023-10-21 NOTE — Progress Notes (Signed)
Post Partum Day 2 Subjective: no complaints, up ad lib, voiding, tolerating PO, + flatus, and lochia mild. She reports no SOB, CP or HA. She is bonding well with baby Lendell Caprice who has been doing well since delivery. She feels ready for discharge to home today  Objective: Blood pressure 127/80, pulse 60, temperature 97.8 F (36.6 C), temperature source Oral, resp. rate 18, height 5\' 6"  (1.676 m), weight 80.8 kg, SpO2 100%, unknown if currently breastfeeding.  Physical Exam:  General: alert, cooperative, and no distress Lochia: appropriate Uterine Fundus: firm Incision: n/a DVT Evaluation: No evidence of DVT seen on physical exam. No significant calf/ankle edema.  Recent Labs    10/20/23 0611 10/21/23 0517  HGB 11.3* 11.1*  HCT 33.0* 32.6*    Assessment/Plan: Discharge home and Breastfeeding BP check in a week. PP visit in 6 weeks   LOS: 2 days   Broadus Costilla W Miriam Kestler, DO 10/21/2023, 11:01 AM

## 2023-10-21 NOTE — Discharge Instructions (Signed)
Call office with any concerns [phone] 

## 2023-10-21 NOTE — Discharge Summary (Signed)
Postpartum Discharge Summary  Date of Service updated      Patient Name: Debra Craig DOB: [birth year] MRN: [id number]  Date of admission: 10/19/2023 Delivery date:10/19/2023 Delivering provider: Carlisle Cater Date of discharge: 10/21/2023  Admitting diagnosis: [redacted] weeks gestation of pregnancy [Z3A.38] Intrauterine pregnancy: [redacted]w[redacted]d     Secondary diagnosis:  Principal Problem:   [redacted] weeks gestation of pregnancy  Additional problems: none    Discharge diagnosis: Term Pregnancy Delivered                                              Post partum procedures: n/a Augmentation: AROM and Pitocin Complications: None  Hospital course: Induction of Labor With Vaginal Delivery   37 y.o. yo [id number] at [redacted]w[redacted]d was admitted to the hospital 10/19/2023 for induction of labor.  Indication for induction: Favorable cervix at term.  Patient had an labor course complicated by n/a Membrane Rupture Time/Date: 4:23 PM,10/19/2023  Delivery Method:Vaginal, Spontaneous Operative Delivery:N/A Episiotomy: None Lacerations:  2nd degree Details of delivery can be found in separate delivery note.  Patient had a postpartum course complicated by n/a. Patient is discharged home 10/21/23.  Newborn Data: Birth date:10/19/2023 Birth time:8:04 PM Gender:Female Living status:Living Apgars:6 ,8  Weight:3340 g  Magnesium Sulfate received: No BMZ received: No Rhophylac:N/A MMR:N/A T-DaP:Given prenatally Flu: Yes RSV Vaccine received: No Transfusion:No Immunizations administered: Immunization History  Administered Date(s) Administered   Influenza,inj,Quad PF,6+ Mos 09/27/2019   Influenza,inj,quad, With Preservative 08/31/2018   Influenza-Unspecified 09/02/2013, 09/01/2014    Physical exam  Vitals:   10/20/23 1106 10/20/23 1455 10/20/23 2014 10/21/23 0513  BP: 126/80 133/87 129/78 127/80  Pulse: 64 70 71 60  Resp: 16 17 17 18   Temp: 98.6 F (37 C) 98 F (36.7 C) 98.1 F (36.7 C) 97.8 F  (36.6 C)  TempSrc: Oral Oral Oral Oral  SpO2: 98%  98% 100%  Weight:      Height:       General: alert, cooperative, and no distress Lochia: appropriate Uterine Fundus: firm Incision: N/A DVT Evaluation: No evidence of DVT seen on physical exam. Labs: Lab Results  Component Value Date   WBC 8.7 10/21/2023   HGB 11.1 (L) 10/21/2023   HCT 32.6 (L) 10/21/2023   MCV 91.3 10/21/2023   PLT 227 10/21/2023      Latest Ref Rng & Units 10/21/2023    5:17 AM  CMP  Glucose 70 - 99 mg/dL 85   BUN 6 - 20 mg/dL 8   Creatinine 1.47 - 8.29 mg/dL 5.62   Sodium 130 - 865 mmol/L 136   Potassium 3.5 - 5.1 mmol/L 4.1   Chloride 98 - 111 mmol/L 107   CO2 22 - 32 mmol/L 21   Calcium 8.9 - 10.3 mg/dL 8.1   Total Protein 6.5 - 8.1 g/dL 4.9   Total Bilirubin <7.8 mg/dL 0.3   Alkaline Phos 38 - 126 U/L 111   AST 15 - 41 U/L 23   ALT 0 - 44 U/L 14    Edinburgh Score:    10/20/2023    7:52 PM  Edinburgh Postnatal Depression Scale Screening Tool  I have been able to laugh and see the funny side of things. 0  I have looked forward with enjoyment to things. 0  I have blamed myself unnecessarily when things went wrong. 0  I have  been anxious or worried for no good reason. 2  I have felt scared or panicky for no good reason. 1  Things have been getting on top of me. 1  I have been so unhappy that I have had difficulty sleeping. 0  I have felt sad or miserable. 0  I have been so unhappy that I have been crying. 1  The thought of harming myself has occurred to me. 0  Edinburgh Postnatal Depression Scale Total 5      After visit meds:  Allergies as of 10/21/2023       Reactions   Penicillins    Did it involve swelling of the face/tongue/throat, SOB, or low BP? No Did it involve sudden or severe rash/hives, skin peeling, or any reaction on the inside of your mouth or nose? no Did you need to seek medical attention at a hospital or doctor's office? No  When did it last happen?       If  all above answers are "NO", may proceed with cephalosporin use.        Medication List     STOP taking these medications    clindamycin 300 MG capsule Commonly known as: CLEOCIN   medroxyPROGESTERone 10 MG tablet Commonly known as: Provera   progesterone 200 MG capsule Commonly known as: Prometrium       TAKE these medications    B-D 3CC LUER-LOK SYR 18GX1-1/2 18G X 1-1/2" 3 ML Misc Generic drug: SYRINGE-NEEDLE (DISP) 3 ML Use to draw out Progesterone   buPROPion 150 MG 24 hr tablet Commonly known as: WELLBUTRIN XL Take 1 tablet (150 mg total) by mouth daily.   Butalbital-APAP-Caffeine 50-300-40 MG Caps Commonly known as: Fioricet Take 1 capsule by mouth every 6 (six) hours as needed for 5 days   ibuprofen 600 MG tablet Commonly known as: ADVIL Take 1 tablet (600 mg total) by mouth every 6 (six) hours as needed for moderate pain (pain score 4-6) or cramping.   multivitamin-prenatal 27-0.8 MG Tabs tablet Take 1 tablet by mouth daily at 12 noon.   sertraline 50 MG tablet Commonly known as: ZOLOFT Take 2 tablets (100 mg total) by mouth daily. What changed: Another medication with the same name was removed. Continue taking this medication, and follow the directions you see here.         Discharge home in stable condition Infant Feeding: Breast Infant Disposition:home with mother Discharge instruction: per After Visit Summary and Postpartum booklet. Activity: Advance as tolerated. Pelvic rest for 6 weeks.  Diet: low salt diet Anticipated Birth Control: Unsure Postpartum Appointment:6 weeks Additional Postpartum F/U: Postpartum Depression checkup and BP check 1 week Future Appointments:No future appointments. Follow up Visit:  Follow-up Information     [facility] Follow up.   Why: 1 week BP check and 6 week postpartum visit Contact information: [address]  [address] [place] [address] [phone]                      10/21/2023 Cathrine Muster, DO

## 2023-10-26 DIAGNOSIS — O139 Gestational [pregnancy-induced] hypertension without significant proteinuria, unspecified trimester: Secondary | ICD-10-CM | POA: Diagnosis not present

## 2023-10-28 ENCOUNTER — Telehealth (HOSPITAL_COMMUNITY): Payer: Self-pay | Admitting: *Deleted

## 2023-10-28 NOTE — Telephone Encounter (Signed)
10/28/2023  Name: Debra Craig MRN: [id number] DOB: 1986-10-15  Reason for Call:  Transition of Care Hospital Discharge Call  Contact Status: Patient Contact Status: Complete  Language assistant needed: Interpreter Mode: Interpreter Not Needed        Follow-Up Questions: Do You Have Any Concerns About Your Health As You Heal From Delivery?: No Do You Have Any Concerns About Your Infants Health?: No  Edinburgh Postnatal Depression Scale:  In the Past 7 Days: I have been able to laugh and see the funny side of things.: As much as I always could I have looked forward with enjoyment to things.: As much as I ever did I have blamed myself unnecessarily when things went wrong.: No, never I have been anxious or worried for no good reason.: Yes, sometimes I have felt scared or panicky for no good reason.: No, not at all Things have been getting on top of me.: No, most of the time I have coped quite well I have been so unhappy that I have had difficulty sleeping.: Not at all I have felt sad or miserable.: No, not at all I have been so unhappy that I have been crying.: No, never The thought of harming myself has occurred to me.: Never Edinburgh Postnatal Depression Scale Total: 3  PHQ2-9 Depression Scale:     Discharge Follow-up: Edinburgh score requires follow up?: No Patient was advised of the following resources:: Support Group, Breastfeeding Support Group  Post-discharge interventions: NA  Maudry Diego, RN 10/28/2023 1300

## 2023-10-29 ENCOUNTER — Inpatient Hospital Stay (HOSPITAL_COMMUNITY): Admit: 2023-10-29 | Payer: 59

## 2023-11-02 ENCOUNTER — Other Ambulatory Visit (HOSPITAL_COMMUNITY): Payer: Self-pay

## 2023-11-26 ENCOUNTER — Other Ambulatory Visit (HOSPITAL_COMMUNITY): Payer: Self-pay

## 2023-12-07 ENCOUNTER — Other Ambulatory Visit (HOSPITAL_COMMUNITY): Payer: Self-pay

## 2023-12-08 DIAGNOSIS — Z3009 Encounter for other general counseling and advice on contraception: Secondary | ICD-10-CM | POA: Diagnosis not present

## 2023-12-08 DIAGNOSIS — Z1331 Encounter for screening for depression: Secondary | ICD-10-CM | POA: Diagnosis not present

## 2023-12-24 ENCOUNTER — Telehealth: Payer: 59 | Admitting: Nurse Practitioner

## 2023-12-24 ENCOUNTER — Other Ambulatory Visit (HOSPITAL_COMMUNITY): Payer: Self-pay

## 2023-12-24 ENCOUNTER — Other Ambulatory Visit: Payer: Self-pay

## 2023-12-24 DIAGNOSIS — J020 Streptococcal pharyngitis: Secondary | ICD-10-CM

## 2023-12-24 MED ORDER — CLINDAMYCIN HCL 300 MG PO CAPS
300.0000 mg | ORAL_CAPSULE | Freq: Three times a day (TID) | ORAL | 0 refills | Status: AC
  Filled 2023-12-24 (×2): qty 30, 10d supply, fill #0

## 2023-12-24 NOTE — Progress Notes (Signed)
E-Visit for Sore Throat - Strep Symptoms  We are sorry that you are not feeling well.  Here is how we plan to help!  Since this has occurred twice we would recommend follow up with your Primary Care to discuss an ENT referral  The Clindamyin may cause stomach upset in your baby, please watch for diarrhea or changes in them and if this occurs you may need to stop the antibiotic early. Please discuss with OBGYN and lactation    Based on what you have shared with me it is likely that you have strep pharyngitis.  Strep pharyngitis is inflammation and infection in the back of the throat.  This is an infection cause by bacteria and is treated with antibiotics.  I have prescribed Clindamycin 300 mg three times a day for 10 days. For throat pain, we recommend over the counter oral pain relief medications such as acetaminophen or aspirin, or anti-inflammatory medications such as ibuprofen or naproxen sodium. Topical treatments such as oral throat lozenges or sprays may be used as needed. Strep infections are not as easily transmitted as other respiratory infections, however we still recommend that you avoid close contact with loved ones, especially the very young and elderly.  Remember to wash your hands thoroughly throughout the day as this is the number one way to prevent the spread of infection and wipe down door knobs and counters with disinfectant.   Home Care: Only take medications as instructed by your medical team. Complete the entire course of an antibiotic. Do not take these medications with alcohol. A steam or ultrasonic humidifier can help congestion.  You can place a towel over your head and breathe in the steam from hot water coming from a faucet. Avoid close contacts especially the very young and the elderly. Cover your mouth when you cough or sneeze. Always remember to wash your hands.  Get Help Right Away If: You develop worsening fever or sinus pain. You develop a severe head ache or  visual changes. Your symptoms persist after you have completed your treatment plan.  Make sure you Understand these instructions. Will watch your condition. Will get help right away if you are not doing well or get worse.   Thank you for choosing an e-visit.  Your e-visit answers were reviewed by a board certified advanced clinical practitioner to complete your personal care plan. Depending upon the condition, your plan could have included both over the counter or prescription medications.  Please review your pharmacy choice. Make sure the pharmacy is open so you can pick up prescription now. If there is a problem, you may contact your provider through [employer] and have the prescription routed to another pharmacy.  Your safety is important to [place]. If you have drug allergies check your prescription carefully.   For the next 24 hours you can use MyChart to ask questions about today's visit, request a non-urgent call back, or ask for a work or school excuse. You will get an email in the next two days asking about your experience. I hope that your e-visit has been valuable and will speed your recovery.   I spent approximately 5 minutes reviewing the patient's history, current symptoms and coordinating their care today.

## 2023-12-30 ENCOUNTER — Encounter: Payer: Self-pay | Admitting: Physician Assistant

## 2023-12-30 ENCOUNTER — Ambulatory Visit: Payer: 59 | Admitting: Physician Assistant

## 2023-12-30 VITALS — BP 110/70 | HR 86 | Temp 98.1°F | Ht 66.0 in | Wt 145.4 lb

## 2023-12-30 DIAGNOSIS — F411 Generalized anxiety disorder: Secondary | ICD-10-CM

## 2023-12-30 DIAGNOSIS — D1803 Hemangioma of intra-abdominal structures: Secondary | ICD-10-CM | POA: Insufficient documentation

## 2023-12-30 DIAGNOSIS — E7800 Pure hypercholesterolemia, unspecified: Secondary | ICD-10-CM

## 2023-12-30 DIAGNOSIS — E559 Vitamin D deficiency, unspecified: Secondary | ICD-10-CM | POA: Insufficient documentation

## 2023-12-30 NOTE — Progress Notes (Signed)
Debra Craig is a 38 y.o. female here for a new problem.  History of Present Illness:   Chief Complaint  Patient presents with   Establish Care   Patient is here to establish care   HPI  Anxiety/Depression  Reports compliance and good tolerance of Wellbutrin XL 150 mg Zoloft 100 mg daily. Was previously on therapy but not interested it therapy at this time.  Symptoms are well controlled at this time. No further concerns.   Elevated Cholesterol  Reports elevated cholesterol levels. Tends to run in the family.  Currently not on any medications. No concerns or symptoms at this time.   Postpartum  Currently 2 months postpartum Did not experience any complications other than gestational HTN at the end of her pregnancy.  No concerns or symptoms at this time.   Past Medical History:  Diagnosis Date   Anxiety    Elevated LDL cholesterol level    Liver hemangioma    Vaginal delivery    2020, 2024   Vitamin D deficiency      Social History   Tobacco Use   Smoking status: Never   Smokeless tobacco: Never  Vaping Use   Vaping status: Never Used  Substance Use Topics   Alcohol use: Not Currently    Comment: social   Drug use: No    Past Surgical History:  Procedure Laterality Date   IUI     KNEE ARTHROSCOPY WITH EXCISION PLICA Right 09/05/2021   Procedure: KNEE ARTHROSCOPY WITH EXCISION PLICA;  Surgeon: Bjorn Pippin, MD;  Location: [facility];  Service: Orthopedics;  Laterality: Right;   vaginal delievery     WISDOM TOOTH EXTRACTION      Family History  Problem Relation Age of Onset   Atrial fibrillation Mother    Healthy Father    Bladder Cancer Maternal Grandmother    Atrial fibrillation Maternal Grandfather    Colon cancer Maternal Grandfather    Stroke Maternal Grandfather    Anxiety disorder Maternal Grandfather    Breast cancer Paternal Grandmother 62       negative genetic testing   Prostate cancer Paternal Grandfather     Allergies   Allergen Reactions   Penicillins     Did it involve swelling of the face/tongue/throat, SOB, or low BP? No Did it involve sudden or severe rash/hives, skin peeling, or any reaction on the inside of your mouth or nose? no Did you need to seek medical attention at a hospital or doctor's office? No  When did it last happen?       If all above answers are "NO", may proceed with cephalosporin use.   Shellfish Allergy Other (See Comments) and Nausea And Vomiting    Current Medications:   Current Outpatient Medications:    buPROPion (WELLBUTRIN XL) 150 MG 24 hr tablet, Take 1 tablet (150 mg total) by mouth daily., Disp: 90 tablet, Rfl: 4   Butalbital-APAP-Caffeine (FIORICET) 50-300-40 MG CAPS, Take 1 capsule by mouth every 6 (six) hours as needed for 5 days, Disp: 20 capsule, Rfl: 1   clindamycin (CLEOCIN) 300 MG capsule, Take 1 capsule (300 mg total) by mouth 3 (three) times daily for 10 days., Disp: 30 capsule, Rfl: 0   ibuprofen (ADVIL) 600 MG tablet, Take 1 tablet (600 mg total) by mouth every 6 (six) hours as needed for moderate pain (pain score 4-6) or cramping., Disp: 40 tablet, Rfl: 1   Prenatal Vit-Fe Fumarate-FA (MULTIVITAMIN-PRENATAL) 27-0.8 MG TABS tablet, Take 1  tablet by mouth daily at 12 noon., Disp: , Rfl:    sertraline (ZOLOFT) 50 MG tablet, Take 2 tablets (100 mg total) by mouth daily., Disp: 180 tablet, Rfl: 3   Review of Systems:   ROS Negative unless otherwise specified per HPI.  Vitals:   Vitals:   12/30/23 1112  BP: 110/70  Pulse: 86  Temp: 98.1 F (36.7 C)  TempSrc: Temporal  SpO2: 98%  Weight: 145 lb 6.1 oz (65.9 kg)  Height: 5\' 6"  (1.676 m)     Body mass index is 23.46 kg/m.  Physical Exam:   Physical Exam Vitals and nursing note reviewed.  Constitutional:      General: She is not in acute distress.    Appearance: She is well-developed. She is not ill-appearing or toxic-appearing.  Cardiovascular:     Rate and Rhythm: Normal rate and regular  rhythm.     Pulses: Normal pulses.     Heart sounds: Normal heart sounds, S1 normal and S2 normal.  Pulmonary:     Effort: Pulmonary effort is normal.     Breath sounds: Normal breath sounds.  Skin:    General: Skin is warm and dry.  Neurological:     Mental Status: She is alert.     GCS: GCS eye subscore is 4. GCS verbal subscore is 5. GCS motor subscore is 6.  Psychiatric:        Speech: Speech normal.        Behavior: Behavior normal. Behavior is cooperative.     Assessment and Plan:   Elevated LDL cholesterol level She plans to get lipid panel checked with gynecology when annual visit arises Consider calcium score at 40  Generalized anxiety disorder Overall controlled Continue Wellbutrin 150 mg and Zoloft 100 mg daily Declines talk therapy Denies suicidal ideation/hi Overall doing better this post-partum period than prior pregnancy Continue to monitor  Jarold Motto, PA-C  I,Safa M Kadhim,acting as a scribe for [employer], PA.,have documented all relevant documentation on the behalf of Jarold Motto, PA,as directed by  Jarold Motto, PA while in the presence of Jarold Motto, [place].   I, Jarold Motto, [place], have reviewed all documentation for this visit. The documentation on 12/30/23 for the exam, diagnosis, procedures, and orders are all accurate and complete.

## 2024-01-11 ENCOUNTER — Other Ambulatory Visit (HOSPITAL_COMMUNITY): Payer: Self-pay

## 2024-01-11 MED ORDER — SERTRALINE HCL 50 MG PO TABS
100.0000 mg | ORAL_TABLET | Freq: Every day | ORAL | 0 refills | Status: DC
  Filled 2024-01-11: qty 180, 90d supply, fill #0

## 2024-03-21 ENCOUNTER — Other Ambulatory Visit (HOSPITAL_COMMUNITY): Payer: Self-pay

## 2024-04-05 ENCOUNTER — Encounter: Payer: Self-pay | Admitting: Neurology

## 2024-04-05 ENCOUNTER — Other Ambulatory Visit (HOSPITAL_COMMUNITY): Payer: Self-pay

## 2024-04-05 DIAGNOSIS — Z01419 Encounter for gynecological examination (general) (routine) without abnormal findings: Secondary | ICD-10-CM | POA: Diagnosis not present

## 2024-04-05 DIAGNOSIS — O99343 Other mental disorders complicating pregnancy, third trimester: Secondary | ICD-10-CM | POA: Diagnosis not present

## 2024-04-05 DIAGNOSIS — Z130 Encounter for screening for diseases of the blood and blood-forming organs and certain disorders involving the immune mechanism: Secondary | ICD-10-CM | POA: Diagnosis not present

## 2024-04-05 DIAGNOSIS — Z1389 Encounter for screening for other disorder: Secondary | ICD-10-CM | POA: Diagnosis not present

## 2024-04-05 MED ORDER — SERTRALINE HCL 50 MG PO TABS
100.0000 mg | ORAL_TABLET | Freq: Every day | ORAL | 0 refills | Status: DC
  Filled 2024-04-05 – 2024-05-03 (×2): qty 180, 90d supply, fill #0

## 2024-04-05 MED ORDER — BUPROPION HCL ER (XL) 150 MG PO TB24
150.0000 mg | ORAL_TABLET | Freq: Every day | ORAL | 0 refills | Status: DC
  Filled 2024-05-03: qty 30, 30d supply, fill #0

## 2024-04-06 ENCOUNTER — Other Ambulatory Visit: Payer: Self-pay

## 2024-04-18 ENCOUNTER — Other Ambulatory Visit: Payer: Self-pay

## 2024-04-18 DIAGNOSIS — R202 Paresthesia of skin: Secondary | ICD-10-CM

## 2024-04-19 ENCOUNTER — Other Ambulatory Visit (HOSPITAL_COMMUNITY): Payer: Self-pay

## 2024-04-20 ENCOUNTER — Encounter: Payer: Self-pay | Admitting: Neurology

## 2024-04-20 ENCOUNTER — Ambulatory Visit (INDEPENDENT_AMBULATORY_CARE_PROVIDER_SITE_OTHER): Admitting: Neurology

## 2024-04-20 VITALS — BP 133/78 | HR 88 | Ht 66.0 in | Wt 142.0 lb

## 2024-04-20 DIAGNOSIS — R253 Fasciculation: Secondary | ICD-10-CM | POA: Diagnosis not present

## 2024-04-20 NOTE — Progress Notes (Signed)
 Follow-up Visit   Date: 04/20/24   Debra Craig MRN: [id number] DOB: 14-Feb-1986   Interim History: Debra Craig is a 38 y.o. Caucasian female with anxiety returning to the clinic for follow-up of muscle twitches.  The patient was accompanied to the clinic by self.  IMPRESSION/PLAN: Benign fasciculations.  Patient has been reassured that she does not have ALS and twitches are benign, exacerbated by stress/anxiety.  Discussed testing such as NCS/EMG, but given that symptoms have been unchanged over the past several years without any new weakness, it is very reasonable to continue to monitor.   Return to clinic as needed  -------------------------------------------------------------- History of present illness: Starting around 2019,  she began having episodic muscle twitch of the right thigh as well as intermittent spells in the toes.  Over the years, it started to involve the thighs and occasionally eyelid.  She denies any weakness, difficulty swallowing, shortness of breath, or difficulty talking.  She endorses high anxiety and looked up muscle twitching online and became even more anxious about possibility of conditions like ALS.  When distracted, such as when playing with her daughter, Debra Craig, she is not aware of it.  In fact, when she was on vacation in the [place], she did not notice it at all.   Of note, her father also has muscle twitches.   UPDATE 04/20/2024:  She continues to have sporadic spells of muscle twitches involving the legs and recently noticed it in the right hand.  Twitches are always worse in the setting of stress/anxiety. She acknowledges that she will google her symptoms and then become worried about ALS.  She denies weakness, numbness/tingling.    She works as Scientist, clinical (histocompatibility and immunogenetics) at [employer].  She is raising two children as a single parent, ages 63 months and 4 years.   Medications:  Current Outpatient Medications on File Prior to Visit  Medication Sig Dispense  Refill   buPROPion  (WELLBUTRIN  XL) 150 MG 24 hr tablet Take 1 tablet (150 mg total) by mouth daily. 90 tablet 4   buPROPion  (WELLBUTRIN  XL) 150 MG 24 hr tablet Take 1 tablet (150 mg total) by mouth daily. 30 tablet 0   Butalbital -APAP-Caffeine  (FIORICET ) 50-300-40 MG CAPS Take 1 capsule by mouth every 6 (six) hours as needed for 5 days 20 capsule 1   ibuprofen  (ADVIL ) 600 MG tablet Take 1 tablet (600 mg total) by mouth every 6 (six) hours as needed for moderate pain (pain score 4-6) or cramping. 40 tablet 1   Prenatal Vit-Fe Fumarate-FA (MULTIVITAMIN-PRENATAL) 27-0.8 MG TABS tablet Take 1 tablet by mouth daily at 12 noon.     sertraline  (ZOLOFT ) 50 MG tablet Take 2 tablets (100 mg total) by mouth daily. 180 tablet 0   No current facility-administered medications on file prior to visit.    Allergies:  Allergies  Allergen Reactions   Penicillins     Did it involve swelling of the face/tongue/throat, SOB, or low BP? No Did it involve sudden or severe rash/hives, skin peeling, or any reaction on the inside of your mouth or nose? no Did you need to seek medical attention at a hospital or doctor's office? No  When did it last happen?       If all above answers are "NO", may proceed with cephalosporin use.   Shellfish Allergy Other (See Comments) and Nausea And Vomiting    Vital Signs:  There were no vitals taken for this visit.   Neurological Exam: MENTAL STATUS including orientation  to time, place, person, recent and remote memory, attention span and concentration, language, and fund of knowledge is normal.  Speech is not dysarthric.  CRANIAL NERVES:  No visual field defects.  Pupils equal round and reactive to light.  Normal conjugate, extra-ocular eye movements in all directions of gaze.  No ptosis.  Face is symmetric. Palate elevates symmetrically.  Tongue is midline.  MOTOR:  Motor strength is 5/5 in all extremities.  No atrophy, fasciculations or abnormal movements.  No pronator drift.   Tone is normal.    MSRs:  Reflexes are 2+/4 throughout.  SENSORY:  Intact to vibration  COORDINATION/GAIT:  Gait narrow based and stable. Stressed gait intact.   Data: n/a  Total time spent reviewing records, interview, history/exam, documentation, and coordination of care on day of encounter:  30 minutes    Thank you for allowing me to participate in patient's care.  If I can answer any additional questions, I would be pleased to do so.    Sincerely,    Leda Bellefeuille K. Lydia Sams, DO

## 2024-04-26 ENCOUNTER — Ambulatory Visit: Admitting: Neurology

## 2024-05-04 ENCOUNTER — Other Ambulatory Visit (HOSPITAL_COMMUNITY): Payer: Self-pay

## 2024-05-13 ENCOUNTER — Encounter: Admitting: Neurology

## 2024-07-19 ENCOUNTER — Other Ambulatory Visit (HOSPITAL_COMMUNITY): Payer: Self-pay

## 2024-07-19 MED ORDER — CEPHALEXIN 500 MG PO CAPS
500.0000 mg | ORAL_CAPSULE | Freq: Three times a day (TID) | ORAL | 0 refills | Status: AC
  Filled 2024-07-19: qty 21, 7d supply, fill #0

## 2024-07-28 ENCOUNTER — Other Ambulatory Visit (HOSPITAL_COMMUNITY): Payer: Self-pay

## 2024-07-28 ENCOUNTER — Telehealth: Admitting: Physician Assistant

## 2024-07-28 DIAGNOSIS — B9789 Other viral agents as the cause of diseases classified elsewhere: Secondary | ICD-10-CM | POA: Diagnosis not present

## 2024-07-28 DIAGNOSIS — J019 Acute sinusitis, unspecified: Secondary | ICD-10-CM

## 2024-07-28 MED ORDER — FLUTICASONE PROPIONATE 50 MCG/ACT NA SUSP
2.0000 | Freq: Every day | NASAL | 0 refills | Status: AC
  Filled 2024-07-28: qty 16, 30d supply, fill #0

## 2024-07-28 NOTE — Progress Notes (Signed)
 E-Visit for Sinus Problems  We are sorry that you are not feeling well.  Here is how we plan to help!  Based on what you have shared with me it looks like you have sinusitis.  Sinusitis is inflammation and infection in the sinus cavities of the head.  Based on your presentation I believe you most likely have Acute Viral Sinusitis.This is an infection most likely caused by a virus. There is not specific treatment for viral sinusitis other than to help you with the symptoms until the infection runs its course.  You may  use plain Mucinex since you are breastfeeding. You can use OTC decongestants but we try to limit these as they can decrease your breast milk supply. Saline nasal spray help and can safely be used as often as needed for congestion, I have prescribed: Fluticasone  nasal spray two sprays in each nostril once a day  Some authorities believe that zinc sprays or the use of Echinacea may shorten the course of your symptoms.  Sinus infections are not as easily transmitted as other respiratory infection, however we still recommend that you avoid close contact with loved ones, especially the very young and elderly.  Remember to wash your hands thoroughly throughout the day as this is the number one way to prevent the spread of infection!  Home Care: Only take medications as instructed by your medical team. Do not take these medications with alcohol. A steam or ultrasonic humidifier can help congestion.  You can place a towel over your head and breathe in the steam from hot water coming from a faucet. Avoid close contacts especially the very young and the elderly. Cover your mouth when you cough or sneeze. Always remember to wash your hands.  Get Help Right Away If: You develop worsening fever or sinus pain. You develop a severe head ache or visual changes. Your symptoms persist after you have completed your treatment plan.  Make sure you Understand these instructions. Will watch your  condition. Will get help right away if you are not doing well or get worse.   Thank you for choosing an e-visit.  Your e-visit answers were reviewed by a board certified advanced clinical practitioner to complete your personal care plan. Depending upon the condition, your plan could have included both over the counter or prescription medications.  Please review your pharmacy choice. Make sure the pharmacy is open so you can pick up prescription now. If there is a problem, you may contact your provider through [employer] and have the prescription routed to another pharmacy.  Your safety is important to us . If you have drug allergies check your prescription carefully.   For the next 24 hours you can use MyChart to ask questions about today's visit, request a non-urgent call back, or ask for a work or school excuse. You will get an email in the next two days asking about your experience. I hope that your e-visit has been valuable and will speed your recovery.

## 2024-07-28 NOTE — Progress Notes (Signed)
 I have spent 5 minutes in review of e-visit questionnaire, review and updating patient chart, medical decision making and response to patient.   Elsie Velma Lunger, PA-C

## 2024-07-31 DIAGNOSIS — R051 Acute cough: Secondary | ICD-10-CM | POA: Diagnosis not present

## 2024-07-31 DIAGNOSIS — J189 Pneumonia, unspecified organism: Secondary | ICD-10-CM | POA: Diagnosis not present

## 2024-08-08 ENCOUNTER — Other Ambulatory Visit (HOSPITAL_COMMUNITY): Payer: Self-pay

## 2024-08-09 ENCOUNTER — Other Ambulatory Visit (HOSPITAL_COMMUNITY): Payer: Self-pay

## 2024-08-09 MED ORDER — SERTRALINE HCL 50 MG PO TABS
100.0000 mg | ORAL_TABLET | Freq: Every day | ORAL | 0 refills | Status: DC
  Filled 2024-08-09: qty 180, 90d supply, fill #0

## 2024-08-17 ENCOUNTER — Other Ambulatory Visit (HOSPITAL_COMMUNITY): Payer: Self-pay

## 2024-08-17 MED ORDER — BUPROPION HCL ER (XL) 150 MG PO TB24
150.0000 mg | ORAL_TABLET | Freq: Every day | ORAL | 7 refills | Status: AC
  Filled 2024-08-17: qty 30, 30d supply, fill #0
  Filled 2024-10-07: qty 30, 30d supply, fill #1
  Filled 2024-11-07: qty 30, 30d supply, fill #2

## 2024-09-21 DIAGNOSIS — M25561 Pain in right knee: Secondary | ICD-10-CM | POA: Diagnosis not present

## 2024-10-07 ENCOUNTER — Other Ambulatory Visit (HOSPITAL_COMMUNITY): Payer: Self-pay

## 2024-10-10 ENCOUNTER — Other Ambulatory Visit (HOSPITAL_COMMUNITY): Payer: Self-pay

## 2024-10-10 ENCOUNTER — Other Ambulatory Visit: Payer: Self-pay

## 2024-10-10 MED ORDER — SERTRALINE HCL 50 MG PO TABS
100.0000 mg | ORAL_TABLET | Freq: Every day | ORAL | 1 refills | Status: AC
  Filled 2024-11-15: qty 180, 90d supply, fill #0

## 2024-10-20 DIAGNOSIS — M2241 Chondromalacia patellae, right knee: Secondary | ICD-10-CM | POA: Diagnosis not present

## 2024-11-15 ENCOUNTER — Other Ambulatory Visit (HOSPITAL_COMMUNITY): Payer: Self-pay

## 2024-12-14 ENCOUNTER — Encounter: Admitting: Physician Assistant

## 2025-02-01 ENCOUNTER — Encounter: Admitting: Physician Assistant
